# Patient Record
Sex: Female | Born: 1990 | Race: Asian | Hispanic: No | Marital: Married | Smoking: Never smoker
Health system: Southern US, Community
[De-identification: ages and names within clinical notes are randomized; demographics above are authoritative.]

## PROBLEM LIST (undated history)

## (undated) DIAGNOSIS — E282 Polycystic ovarian syndrome: Secondary | ICD-10-CM

## (undated) DIAGNOSIS — Z789 Other specified health status: Secondary | ICD-10-CM

## (undated) DIAGNOSIS — O24419 Gestational diabetes mellitus in pregnancy, unspecified control: Secondary | ICD-10-CM

## (undated) HISTORY — DX: Polycystic ovarian syndrome: E28.2

## (undated) HISTORY — PX: NO PAST SURGERIES: SHX2092

## (undated) HISTORY — DX: Other specified health status: Z78.9

---
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
## 2015-06-17 NOTE — L&D Delivery Note (Signed)
Delivery Note At 5:30 AM a viable and healthy female was delivered via Vaginal, Spontaneous Delivery (Presentation:OA ).  APGAR: 8, 9; weight  -pending  Placenta status: spontaneous and complete. No cord complications: .  Cord pH: none  Anesthesia: Spinal   Episiotomy: None Lacerations:  1st degree perineal Suture Repair: 3.0 vicryl rapide Est. Blood Loss (mL):  250 cc  Mom to postpartum.  Baby to Couplet care / Skin to Skin.  Earlisha Sharples R 01/09/2016, 6:00 AM

## 2015-06-27 LAB — OB RESULTS CONSOLE RUBELLA ANTIBODY, IGM: RUBELLA: IMMUNE

## 2015-06-27 LAB — OB RESULTS CONSOLE ABO/RH: RH Type: POSITIVE

## 2015-06-27 LAB — OB RESULTS CONSOLE GC/CHLAMYDIA
Chlamydia: NEGATIVE
GC PROBE AMP, GENITAL: NEGATIVE

## 2015-06-27 LAB — OB RESULTS CONSOLE HEPATITIS B SURFACE ANTIGEN: Hepatitis B Surface Ag: NEGATIVE

## 2015-06-27 LAB — OB RESULTS CONSOLE HIV ANTIBODY (ROUTINE TESTING): HIV: NONREACTIVE

## 2015-06-27 LAB — OB RESULTS CONSOLE ANTIBODY SCREEN: ANTIBODY SCREEN: NEGATIVE

## 2015-06-27 LAB — OB RESULTS CONSOLE RPR: RPR: NONREACTIVE

## 2015-06-29 ENCOUNTER — Ambulatory Visit (INDEPENDENT_AMBULATORY_CARE_PROVIDER_SITE_OTHER): Payer: BLUE CROSS/BLUE SHIELD | Admitting: Internal Medicine

## 2015-06-29 ENCOUNTER — Encounter: Payer: Self-pay | Admitting: Internal Medicine

## 2015-06-29 DIAGNOSIS — Z23 Encounter for immunization: Secondary | ICD-10-CM

## 2015-06-29 DIAGNOSIS — Z9189 Other specified personal risk factors, not elsewhere classified: Secondary | ICD-10-CM

## 2015-06-29 MED ORDER — AZITHROMYCIN 500 MG PO TABS: 500.0000 mg | ORAL_TABLET | Freq: Every day | ORAL | Status: DC

## 2015-06-29 NOTE — Progress Notes (Signed)
   Subjective:    Patient ID: Misty Harris, female    DOB: [birth year], 25 y.o.   MRN: [id number]  HPI  zarai is a healthy 25y F in good state of health, currently [redacted] wk pregnant without any 1st trimester morning sickness, who will be going to [place] for [place]. Leaving feb 18th thru feb 25th. The patient has recently returned from [place] visiting family without difficulty  She is unsure of her previous vaccines that she received for her greencard last year. She will bring back list   She has not yet had flu vaccine or tdap, just met her ob this week  She is here to meningococcal vaccine for her visa application   Review of Systems     Objective:   Physical Exam        Assessment & Plan:  Pre-travel counseling in addition to vaccine recs:  -meningococcal vaccine today - recommend that she gets tdap and flu (sooner than later) - also recommend to get injectable typhoid and hep A, safe for pregnancy since neither are live vaccines. Can receive 10 days prior to leaving

## 2015-11-14 ENCOUNTER — Ambulatory Visit: Payer: BLUE CROSS/BLUE SHIELD | Admitting: Skilled Nursing Facility1

## 2015-11-26 ENCOUNTER — Encounter: Payer: BLUE CROSS/BLUE SHIELD | Attending: Obstetrics & Gynecology | Admitting: Skilled Nursing Facility1

## 2015-11-26 ENCOUNTER — Encounter: Payer: Self-pay | Admitting: Skilled Nursing Facility1

## 2015-11-26 VITALS — Ht 64.0 in | Wt 213.0 lb

## 2015-11-26 DIAGNOSIS — O24410 Gestational diabetes mellitus in pregnancy, diet controlled: Secondary | ICD-10-CM

## 2015-11-26 DIAGNOSIS — O24419 Gestational diabetes mellitus in pregnancy, unspecified control: Secondary | ICD-10-CM | POA: Insufficient documentation

## 2015-11-26 NOTE — Progress Notes (Signed)
  Patient was seen on 11/26/2015 for Gestational Diabetes self-management class at the [facility]. The following learning objectives were met by the patient during this course:   States the definition of Gestational Diabetes  States why dietary management is important in controlling blood glucose  Describes the effects each nutrient has on blood glucose levels  Demonstrates ability to create a balanced meal plan  Demonstrates carbohydrate counting   States when to check blood glucose levels involving a total of 4 separate occurences in a day  Demonstrates proper blood glucose monitoring techniques  States the effect of stress and exercise on blood glucose levels  States the importance of limiting caffeine and abstaining from alcohol and smoking  Demonstrates the knowledge the glucometer provided in class may not be covered by their insurance and to call their insurance provider immediately after class to know which glucometer their insurance provider does cover as well as calling their physician the next day for a prescription to the glucometer their insurance does cover (if the one provided is not) as well as the lancets and strips for that meter.  Blood glucose monitor given: Pt had their own Blood glucose reading: Did not bring their meter to class.  Patient instructed to monitor glucose levels: FBS: 60 - <90 1 hour: <140 2 hour: <120  *Patient received handouts:  Nutrition Diabetes and Pregnancy  Carbohydrate Counting List  Patient will be seen for follow-up as needed.

## 2015-12-04 DIAGNOSIS — O24414 Gestational diabetes mellitus in pregnancy, insulin controlled: Secondary | ICD-10-CM | POA: Diagnosis not present

## 2015-12-04 DIAGNOSIS — Z3A33 33 weeks gestation of pregnancy: Secondary | ICD-10-CM | POA: Diagnosis not present

## 2015-12-05 ENCOUNTER — Other Ambulatory Visit (HOSPITAL_COMMUNITY): Payer: Self-pay | Admitting: Obstetrics & Gynecology

## 2015-12-05 DIAGNOSIS — Z3689 Encounter for other specified antenatal screening: Secondary | ICD-10-CM

## 2015-12-05 DIAGNOSIS — O283 Abnormal ultrasonic finding on antenatal screening of mother: Secondary | ICD-10-CM

## 2015-12-05 DIAGNOSIS — Z3A36 36 weeks gestation of pregnancy: Secondary | ICD-10-CM

## 2015-12-12 ENCOUNTER — Encounter (HOSPITAL_COMMUNITY): Payer: Self-pay | Admitting: Obstetrics & Gynecology

## 2015-12-20 ENCOUNTER — Other Ambulatory Visit (HOSPITAL_COMMUNITY): Payer: Self-pay | Admitting: Obstetrics & Gynecology

## 2015-12-20 ENCOUNTER — Encounter (HOSPITAL_COMMUNITY): Payer: Self-pay

## 2015-12-20 ENCOUNTER — Ambulatory Visit (HOSPITAL_COMMUNITY)
Admission: RE | Admit: 2015-12-20 | Discharge: 2015-12-20 | Disposition: A | Discharge status: Home / Self Care | Payer: BLUE CROSS/BLUE SHIELD | Source: Ambulatory Visit | Attending: Obstetrics & Gynecology | Admitting: Obstetrics & Gynecology

## 2015-12-20 ENCOUNTER — Ambulatory Visit (HOSPITAL_COMMUNITY): Admission: RE | Admit: 2015-12-20 | Payer: BLUE CROSS/BLUE SHIELD | Source: Ambulatory Visit

## 2015-12-20 DIAGNOSIS — O283 Abnormal ultrasonic finding on antenatal screening of mother: Secondary | ICD-10-CM

## 2015-12-20 DIAGNOSIS — O358XX0 Maternal care for other (suspected) fetal abnormality and damage, not applicable or unspecified: Secondary | ICD-10-CM | POA: Diagnosis not present

## 2015-12-20 DIAGNOSIS — Z3A36 36 weeks gestation of pregnancy: Secondary | ICD-10-CM | POA: Diagnosis not present

## 2015-12-20 DIAGNOSIS — O24414 Gestational diabetes mellitus in pregnancy, insulin controlled: Secondary | ICD-10-CM | POA: Diagnosis not present

## 2015-12-20 DIAGNOSIS — Z36 Encounter for antenatal screening of mother: Secondary | ICD-10-CM | POA: Insufficient documentation

## 2015-12-20 DIAGNOSIS — Z3689 Encounter for other specified antenatal screening: Secondary | ICD-10-CM

## 2015-12-20 LAB — OB RESULTS CONSOLE GBS: STREP GROUP B AG: POSITIVE

## 2015-12-21 ENCOUNTER — Encounter (HOSPITAL_COMMUNITY): Payer: Self-pay

## 2015-12-28 DIAGNOSIS — Z3A37 37 weeks gestation of pregnancy: Secondary | ICD-10-CM | POA: Diagnosis not present

## 2015-12-28 DIAGNOSIS — O24414 Gestational diabetes mellitus in pregnancy, insulin controlled: Secondary | ICD-10-CM | POA: Diagnosis not present

## 2015-12-31 DIAGNOSIS — O24414 Gestational diabetes mellitus in pregnancy, insulin controlled: Secondary | ICD-10-CM | POA: Diagnosis not present

## 2015-12-31 DIAGNOSIS — Z3A37 37 weeks gestation of pregnancy: Secondary | ICD-10-CM | POA: Diagnosis not present

## 2016-01-01 ENCOUNTER — Telehealth (HOSPITAL_COMMUNITY): Payer: Self-pay | Admitting: *Deleted

## 2016-01-01 NOTE — Telephone Encounter (Signed)
Preadmission screenPreadmission screen 

## 2016-01-02 ENCOUNTER — Encounter (HOSPITAL_COMMUNITY): Payer: Self-pay | Admitting: *Deleted

## 2016-01-03 DIAGNOSIS — O24414 Gestational diabetes mellitus in pregnancy, insulin controlled: Secondary | ICD-10-CM | POA: Diagnosis not present

## 2016-01-03 DIAGNOSIS — Z3A37 37 weeks gestation of pregnancy: Secondary | ICD-10-CM | POA: Diagnosis not present

## 2016-01-04 ENCOUNTER — Other Ambulatory Visit: Payer: Self-pay | Admitting: Obstetrics & Gynecology

## 2016-01-08 ENCOUNTER — Inpatient Hospital Stay (HOSPITAL_COMMUNITY): Payer: BLUE CROSS/BLUE SHIELD | Admitting: Anesthesiology

## 2016-01-08 ENCOUNTER — Encounter (HOSPITAL_COMMUNITY): Payer: Self-pay

## 2016-01-08 ENCOUNTER — Inpatient Hospital Stay (HOSPITAL_COMMUNITY)
Admission: RE | Admit: 2016-01-08 | Discharge: 2016-01-11 | DRG: 775 | Disposition: A | Discharge status: Home / Self Care | Payer: BLUE CROSS/BLUE SHIELD | Source: Ambulatory Visit | Attending: Obstetrics & Gynecology | Admitting: Obstetrics & Gynecology

## 2016-01-08 DIAGNOSIS — O24419 Gestational diabetes mellitus in pregnancy, unspecified control: Secondary | ICD-10-CM

## 2016-01-08 DIAGNOSIS — O99214 Obesity complicating childbirth: Secondary | ICD-10-CM | POA: Diagnosis not present

## 2016-01-08 DIAGNOSIS — O24424 Gestational diabetes mellitus in childbirth, insulin controlled: Secondary | ICD-10-CM | POA: Diagnosis not present

## 2016-01-08 DIAGNOSIS — Z6836 Body mass index (BMI) 36.0-36.9, adult: Secondary | ICD-10-CM

## 2016-01-08 DIAGNOSIS — Z833 Family history of diabetes mellitus: Secondary | ICD-10-CM

## 2016-01-08 DIAGNOSIS — Z3A39 39 weeks gestation of pregnancy: Secondary | ICD-10-CM

## 2016-01-08 DIAGNOSIS — Z8249 Family history of ischemic heart disease and other diseases of the circulatory system: Secondary | ICD-10-CM | POA: Diagnosis not present

## 2016-01-08 DIAGNOSIS — E669 Obesity, unspecified: Secondary | ICD-10-CM | POA: Diagnosis not present

## 2016-01-08 DIAGNOSIS — E282 Polycystic ovarian syndrome: Secondary | ICD-10-CM | POA: Diagnosis present

## 2016-01-08 DIAGNOSIS — O99824 Streptococcus B carrier state complicating childbirth: Secondary | ICD-10-CM | POA: Diagnosis present

## 2016-01-08 DIAGNOSIS — O24425 Gestational diabetes mellitus in childbirth, controlled by oral hypoglycemic drugs: Principal | ICD-10-CM | POA: Diagnosis present

## 2016-01-08 HISTORY — DX: Gestational diabetes mellitus in pregnancy, unspecified control: O24.419

## 2016-01-08 LAB — GLUCOSE, RANDOM: Glucose, Bld: 99 mg/dL (ref 65–99)

## 2016-01-08 LAB — CBC
HEMATOCRIT: 38.2 % (ref 36.0–46.0)
HEMOGLOBIN: 12.5 g/dL (ref 12.0–15.0)
MCH: 25.6 pg — AB (ref 26.0–34.0)
MCHC: 32.7 g/dL (ref 30.0–36.0)
MCV: 78.1 fL (ref 78.0–100.0)
Platelets: 219 10*3/uL (ref 150–400)
RBC: 4.89 MIL/uL (ref 3.87–5.11)
RDW: 19.4 % — ABNORMAL HIGH (ref 11.5–15.5)
WBC: 11.1 10*3/uL — ABNORMAL HIGH (ref 4.0–10.5)

## 2016-01-08 LAB — GLUCOSE, CAPILLARY
GLUCOSE-CAPILLARY: 90 mg/dL (ref 65–99)
GLUCOSE-CAPILLARY: 80 mg/dL (ref 65–99)
Glucose-Capillary: 65 mg/dL (ref 65–99)

## 2016-01-08 LAB — TYPE AND SCREEN
ABO/RH(D): A POS
Antibody Screen: NEGATIVE

## 2016-01-08 LAB — ABO/RH: ABO/RH(D): A POS

## 2016-01-08 LAB — RPR: RPR: NONREACTIVE

## 2016-01-08 MED ORDER — MISOPROSTOL 25 MCG QUARTER TABLET
25.0000 ug | ORAL_TABLET | ORAL | Status: DC | PRN
  Administered 2016-01-08: 25 ug via VAGINAL
  Filled 2016-01-08 (×2): qty 0.25

## 2016-01-08 MED ORDER — FENTANYL 2.5 MCG/ML BUPIVACAINE 1/10 % EPIDURAL INFUSION (WH - ANES)
14.0000 mL/h | INTRAMUSCULAR | Status: DC | PRN
  Administered 2016-01-08 – 2016-01-09 (×2): 14 mL/h via EPIDURAL
  Filled 2016-01-08 (×2): qty 125

## 2016-01-08 MED ORDER — LACTATED RINGERS IV SOLN
INTRAVENOUS | Status: DC
  Administered 2016-01-08 (×2): via INTRAVENOUS

## 2016-01-08 MED ORDER — PENICILLIN G POTASSIUM 5000000 UNITS IJ SOLR
2.5000 10*6.[IU] | INTRAVENOUS | Status: DC
  Administered 2016-01-08 – 2016-01-09 (×4): 2.5 10*6.[IU] via INTRAVENOUS
  Filled 2016-01-08 (×9): qty 2.5

## 2016-01-08 MED ORDER — EPHEDRINE 5 MG/ML INJ: 10.0000 mg | INTRAVENOUS | Status: DC | PRN

## 2016-01-08 MED ORDER — LIDOCAINE HCL (PF) 1 % IJ SOLN
INTRAMUSCULAR | Status: DC | PRN
  Administered 2016-01-08: 8 mL via EPIDURAL
  Administered 2016-01-08: 5 mL via EPIDURAL

## 2016-01-08 MED ORDER — PHENYLEPHRINE 40 MCG/ML (10ML) SYRINGE FOR IV PUSH (FOR BLOOD PRESSURE SUPPORT): 80.0000 ug | PREFILLED_SYRINGE | INTRAVENOUS | Status: DC | PRN

## 2016-01-08 MED ORDER — TERBUTALINE SULFATE 1 MG/ML IJ SOLN: 0.2500 mg | Freq: Once | INTRAMUSCULAR | Status: DC | PRN

## 2016-01-08 MED ORDER — DIPHENHYDRAMINE HCL 50 MG/ML IJ SOLN: 12.5000 mg | INTRAMUSCULAR | Status: DC | PRN

## 2016-01-08 MED ORDER — NALBUPHINE HCL 10 MG/ML IJ SOLN
10.0000 mg | Freq: Once | INTRAMUSCULAR | Status: AC
  Administered 2016-01-08: 10 mg via INTRAVENOUS
  Filled 2016-01-08: qty 1

## 2016-01-08 MED ORDER — OXYTOCIN 40 UNITS IN LACTATED RINGERS INFUSION - SIMPLE MED
1.0000 m[IU]/min | INTRAVENOUS | Status: DC
  Administered 2016-01-08: 2 m[IU]/min via INTRAVENOUS
  Filled 2016-01-08: qty 1000

## 2016-01-08 MED ORDER — PENICILLIN G POTASSIUM 5000000 UNITS IJ SOLR
5.0000 10*6.[IU] | Freq: Once | INTRAVENOUS | Status: AC
  Administered 2016-01-08: 5 10*6.[IU] via INTRAVENOUS
  Filled 2016-01-08: qty 5

## 2016-01-08 MED ORDER — LACTATED RINGERS IV SOLN
500.0000 mL | Freq: Once | INTRAVENOUS | Status: AC
  Administered 2016-01-08: 500 mL via INTRAVENOUS

## 2016-01-08 MED ORDER — PHENYLEPHRINE 40 MCG/ML (10ML) SYRINGE FOR IV PUSH (FOR BLOOD PRESSURE SUPPORT)
80.0000 ug | PREFILLED_SYRINGE | INTRAVENOUS | Status: DC | PRN
  Filled 2016-01-08: qty 10

## 2016-01-08 NOTE — Anesthesia Pain Management Evaluation Note (Signed)
  CRNA Pain Management Visit Note  Patient: Misty Harris, 25 y.o., female  "Hello I am a member of the anesthesia team at [facility]. We have an anesthesia team available at all times to provide care throughout the hospital, including epidural management and anesthesia for C-section. I don't know your plan for the delivery whether it a natural birth, water birth, IV sedation, nitrous supplementation, doula or epidural, but we want to meet your pain goals."   1.Was your pain managed to your expectations on prior hospitalizations?   No prior hospitalizations  2.What is your expectation for pain management during this hospitalization?     Epidural  3.How can we help you reach that goal? epidural  Record the patient's initial score and the patient's pain goal.   Pain: 1  Pain Goal: 8 [facility] wants you to be able to say your pain was always managed very well.  Edison Pace 01/08/2016

## 2016-01-08 NOTE — Anesthesia Procedure Notes (Signed)
Epidural Patient location during procedure: OB Start time: 01/08/2016 5:28 PM End time: 01/08/2016 5:32 PM  Staffing Anesthesiologist: Leilani Able Performed: anesthesiologist   Preanesthetic Checklist Completed: patient identified, surgical consent, pre-op evaluation, timeout performed, IV checked, risks and benefits discussed and monitors and equipment checked  Epidural Patient position: sitting Prep: site prepped and draped and DuraPrep Patient monitoring: continuous pulse ox and blood pressure Approach: midline Location: L3-L4 Injection technique: LOR air  Needle:  Needle type: Tuohy  Needle gauge: 17 G Needle length: 9 cm and 9 Needle insertion depth: 5 cm cm Catheter type: closed end flexible Catheter size: 19 Gauge Catheter at skin depth: 10 cm Test dose: negative and Other  Assessment Sensory level: T9 Events: blood not aspirated, injection not painful, no injection resistance, negative IV test and no paresthesia

## 2016-01-08 NOTE — Progress Notes (Signed)
Misty Harris is a 25 y.o. G1P0 at [redacted]w[redacted]d by ultrasound admitted for induction of labor due to Gestational diabetes.  Subjective:   Objective: BP 119/82   Pulse 78   Temp 98 F (36.7 C) (Oral)   Resp 18   Ht 5\' 4"  (1.626 m)   Wt 215 lb (97.5 kg)   BMI 36.90 kg/m  No intake/output data recorded. Total I/O In: 120 [P.O.:120] Out: -   FHT:  FHR: 150 bpm, variability: moderate,  accelerations:  Present,  decelerations:  Absent UC:   regular, every 3 minutes SVE:   Dilation: 2 Effacement (%): 50 Station: -2 Exam by:: Marcelino Duster, RN  AROM, clear, high station.   Labs: Lab Results  Component Value Date   WBC 11.1 (H) 01/08/2016   HGB 12.5 01/08/2016   HCT 38.2 01/08/2016   MCV 78.1 01/08/2016   PLT 219 01/08/2016    Assessment / Plan: Induction of labor due to gestational diabetes,  progressing well on pitocin  Labor: Progressing normally, early labor  Preeclampsia:  None Fetal Wellbeing:  Category I Pain Control:  Labor support without medications I/D:  GBS(+), started PCN Anticipated MOD:  Guarded, assess descent  Limit exams. Ok to Nubain later, reduced pitocin now to 2 mu rate, ambulate in room  Teiara Baria R 01/08/2016, 4:10 PM

## 2016-01-08 NOTE — H&P (Signed)
Misty Harris is a 25 y.o. female presenting for labor IOL at 39 wks for A2GDM. Infertility and PCOS hx, pregnancy conceived with Femara, weight loss, Metformin. Stopped Metformin herself at positive preg test.  A2GDM, poor diet choices, started Glyburide 1.25 mg bid and Metformin 500 mg bid at 32 wks. Increased Glyburide to 2.5 mg bid at 36 wks Growth at 28 wk AGA, 34 wks - 5'2" 71%.  MFM sono at 36 wks for slight ventriculomegaly, left lateral ventricle 10.3 mm. Also Right Renal pelvis dilated, last sono 10 mm at 34 wks  OB History    Gravida Para Term Preterm AB Living   1         0   SAB TAB Ectopic Multiple Live Births                 Past Medical History:  Diagnosis Date  . GDM, class A2 01/08/2016  . Medical history non-contributory    Past Surgical History:  Procedure Laterality Date  . NO PAST SURGERIES     Family History: family history includes Diabetes in her father; Heart disease in her father; Hypertension in her father. Social History:  reports that she has never smoked. She has never used smokeless tobacco. She reports that she does not drink alcohol or use drugs.     Maternal Diabetes: Yes:  Diabetes Type:  Insulin/Medication controlled Genetic Screening: Normal Maternal Ultrasounds/Referrals: Right renal pylectasis, last sono 10 mm at 34 wks. Left lateral ventriculomegaly- slight, 10.3 mm at 36 wks and saw MFM for that at 36 wks, no other structural abnormalities noted. Recommended Peds to f/up after birth.  Fetal Ultrasounds or other Referrals:  MFM as above at 36 wks  Maternal Substance Abuse:  No Significant Maternal Medications:  Meds include: Other:  Glyburide 2.5 mg bid, Metformin 500mg  bid Significant Maternal Lab Results:  Lab values include: Group B Strep positive Other Comments:  None  ROS neg  History Dilation: Closed Effacement (%): 50 Station: -2 Exam by:: Marcelino Duster, RN  Blood pressure 116/71, pulse 85, temperature 98 F (36.7 C), temperature  source Oral, resp. rate 18, height 5\' 4"  (1.626 m), weight 215 lb (97.5 kg). Exam Physical Exam  A&O x 3, no acute distress. Pleasant HEENT neg, no thyromegaly Lungs CTA bilat CV RRR, S1S2 normal Abdo soft, non tender, non acute Extr no edema/ tenderness Pelvic above  FHT  140/ + accels/ no decels/ mod variab category I Toco q 3 minutes/    Prenatal labs: ABO, Rh: --/--/A POS, A POS (07/25 0100) Antibody: NEG (07/25 0100) Rubella: Immune (01/11 0000) RPR: Non Reactive (07/25 0100)  HBsAg: Negative (01/11 0000)  HIV: Non-reactive (01/11 0000)  GBS: Positive (07/06 0000)   Assessment/Plan: 25 yo G1 at 39 wks, A2GDM. IOL. Cytotec x 1 last night. Could not give more Cytotec due to UCs. Now on pitocin.  BG checks q 4 hrs.  FHT now category I. EWF anticipate SVD provided adequate descent noted in labor. Early labor at present.   Fetal abnormal sono findings - Left lateral ventricle slightly dilated at 10.3 mm and right renal pelvis dilated at 36 wks, f/up after birth.   Misty Harris R 01/08/2016, 2:41 PM

## 2016-01-08 NOTE — Progress Notes (Signed)
Misty Harris is a 25 y.o. G1P0 at [redacted]w[redacted]d A2 GDM IOL Subjective: Could not tolerate UCs at 2 mu rate, so gave early epidural   Objective: BP 113/80   Pulse 69   Temp 97.7 F (36.5 C) (Oral)   Resp 18   Ht 5\' 4"  (1.626 m)   Wt 215 lb (97.5 kg)   SpO2 100%   BMI 36.90 kg/m  I/O last 3 completed shifts: In: 240 [P.O.:240] Out: -  No intake/output data recorded.  FHT:  FHR: 120s bpm, variability: moderate,  accelerations:  Present,  decelerations:  Absent- late decels resolved since pitocin was turned off  UC:   regular, every 3 minutes SVE:   Dilation: 2 Effacement (%): 70 Station: -2 Exam by:: Marcelino Duster, RN   Labs: Lab Results  Component Value Date   WBC 11.1 (H) 01/08/2016   HGB 12.5 01/08/2016   HCT 38.2 01/08/2016   MCV 78.1 01/08/2016   PLT 219 01/08/2016    Assessment / Plan: Restarted pitocin since FHT category I. Need to increase to allow good labor pattern. High station and borderline pelvis.  MOD: Guarded.   Kyan Yurkovich R 01/08/2016, 8:10 PM

## 2016-01-08 NOTE — Anesthesia Preprocedure Evaluation (Signed)
Anesthesia Evaluation  Patient identified by MRN, date of birth, ID band Patient awake    Reviewed: Allergy & Precautions, H&P , NPO status , Patient's Chart, lab work & pertinent test results  Airway Mallampati: II  TM Distance: >3 FB Neck ROM: full    Dental no notable dental hx.    Pulmonary neg pulmonary ROS,    Pulmonary exam normal        Cardiovascular negative cardio ROS Normal cardiovascular exam     Neuro/Psych negative neurological ROS  negative psych ROS   GI/Hepatic negative GI ROS, Neg liver ROS,   Endo/Other  negative endocrine ROSdiabetes, Gestational, Oral Hypoglycemic Agents  Renal/GU negative Renal ROS     Musculoskeletal   Abdominal (+) + obese,   Peds  Hematology negative hematology ROS (+)   Anesthesia Other Findings   Reproductive/Obstetrics (+) Pregnancy                             Anesthesia Physical Anesthesia Plan  ASA: II  Anesthesia Plan: Epidural   Post-op Pain Management:    Induction:   Airway Management Planned:   Additional Equipment:   Intra-op Plan:   Post-operative Plan:   Informed Consent: I have reviewed the patients History and Physical, chart, labs and discussed the procedure including the risks, benefits and alternatives for the proposed anesthesia with the patient or authorized representative who has indicated his/her understanding and acceptance.     Plan Discussed with:   Anesthesia Plan Comments:         Anesthesia Quick Evaluation

## 2016-01-09 ENCOUNTER — Encounter (HOSPITAL_COMMUNITY): Payer: Self-pay

## 2016-01-09 LAB — GLUCOSE, CAPILLARY
GLUCOSE-CAPILLARY: 100 mg/dL — AB (ref 65–99)
Glucose-Capillary: 77 mg/dL (ref 65–99)

## 2016-01-09 MED ORDER — LIDOCAINE HCL (PF) 1 % IJ SOLN
INTRAMUSCULAR | Status: AC
  Filled 2016-01-09: qty 30

## 2016-01-09 MED ORDER — IBUPROFEN 600 MG PO TABS
600.0000 mg | ORAL_TABLET | Freq: Four times a day (QID) | ORAL | Status: DC
  Administered 2016-01-09 – 2016-01-11 (×8): 600 mg via ORAL
  Filled 2016-01-09 (×8): qty 1

## 2016-01-09 MED ORDER — DIPHENHYDRAMINE HCL 25 MG PO CAPS: 25.0000 mg | ORAL_CAPSULE | Freq: Four times a day (QID) | ORAL | Status: DC | PRN

## 2016-01-09 MED ORDER — ONDANSETRON HCL 4 MG/2ML IJ SOLN: 4.0000 mg | INTRAMUSCULAR | Status: DC | PRN

## 2016-01-09 MED ORDER — SIMETHICONE 80 MG PO CHEW: 80.0000 mg | CHEWABLE_TABLET | ORAL | Status: DC | PRN

## 2016-01-09 MED ORDER — PRENATAL MULTIVITAMIN CH
1.0000 | ORAL_TABLET | Freq: Every day | ORAL | Status: DC
  Administered 2016-01-09 – 2016-01-10 (×2): 1 via ORAL
  Filled 2016-01-09 (×2): qty 1

## 2016-01-09 MED ORDER — ONDANSETRON HCL 4 MG PO TABS: 4.0000 mg | ORAL_TABLET | ORAL | Status: DC | PRN

## 2016-01-09 MED ORDER — TETANUS-DIPHTH-ACELL PERTUSSIS 5-2.5-18.5 LF-MCG/0.5 IM SUSP
0.5000 mL | Freq: Once | INTRAMUSCULAR | Status: AC
  Administered 2016-01-10: 0.5 mL via INTRAMUSCULAR

## 2016-01-09 MED ORDER — WITCH HAZEL-GLYCERIN EX PADS: 1.0000 "application " | MEDICATED_PAD | CUTANEOUS | Status: DC | PRN

## 2016-01-09 MED ORDER — ZOLPIDEM TARTRATE 5 MG PO TABS: 5.0000 mg | ORAL_TABLET | Freq: Every evening | ORAL | Status: DC | PRN

## 2016-01-09 MED ORDER — COCONUT OIL OIL
1.0000 "application " | TOPICAL_OIL | Status: DC | PRN
  Filled 2016-01-09: qty 120

## 2016-01-09 MED ORDER — ACETAMINOPHEN 325 MG PO TABS
650.0000 mg | ORAL_TABLET | ORAL | Status: DC | PRN
  Administered 2016-01-09 – 2016-01-11 (×6): 650 mg via ORAL
  Filled 2016-01-09 (×7): qty 2

## 2016-01-09 MED ORDER — SENNOSIDES-DOCUSATE SODIUM 8.6-50 MG PO TABS
2.0000 | ORAL_TABLET | ORAL | Status: DC
  Administered 2016-01-09 – 2016-01-10 (×2): 2 via ORAL
  Filled 2016-01-09 (×2): qty 2

## 2016-01-09 MED ORDER — BENZOCAINE-MENTHOL 20-0.5 % EX AERO
1.0000 "application " | INHALATION_SPRAY | CUTANEOUS | Status: DC | PRN
  Administered 2016-01-09: 1 via TOPICAL
  Filled 2016-01-09 (×3): qty 56

## 2016-01-09 MED ORDER — DIBUCAINE 1 % RE OINT
1.0000 "application " | TOPICAL_OINTMENT | RECTAL | Status: DC | PRN
  Filled 2016-01-09: qty 28.4

## 2016-01-09 NOTE — Lactation Note (Addendum)
This note was copied from a baby's chart. Lactation Consultation Note:  Lactation Brochure given to patient with basic teaching done. This is the second attempt to see this mother for feeding assist. Infant has has low cbg of 37 ,46 and 38. Infant has been bottle fed formula after each cbg. Mother states she tried to latch infant and she doesn't have enough milk.  Mother taught hand expression and observed good flow of colostrum.  Suggested to mother to page for [facility] with next feeding attempt.  Reviewed baby and me book. Informed mother that infant needs to breastfeed 8-12 times in 24 hours. Instruct mother in cue base feeding. Mother will page for assistance with next feeding attempt.   Patient Name: Boy Sheniqua Kohnen [id number] Date: 01/09/2016 Reason for consult: Initial assessment   Maternal Data Has patient been taught Hand Expression?: Yes Does the patient have breastfeeding experience prior to this delivery?: No  Feeding Feeding Type: Bottle Fed - Formula Nipple Type: Slow - flow  LATCH Score/Interventions Latch: Repeated attempts needed to sustain latch, nipple held in mouth throughout feeding, stimulation needed to elicit sucking reflex. (on off vigorus suckling expresed colostrum) Intervention(s): Adjust position;Assist with latch  Audible Swallowing: A few with stimulation Intervention(s): Skin to skin;Hand expression Intervention(s): Skin to skin;Hand expression  Type of Nipple: Everted at rest and after stimulation  Comfort (Breast/Nipple): Soft / non-tender     Hold (Positioning): Full assist, staff holds infant at breast  LATCH Score: 6  Lactation Tools Discussed/Used     Consult Status Consult Status: Follow-up Date: 01/09/16 Follow-up type: In-patient    Stevan Born [facility] 01/09/2016, 2:37 PM

## 2016-01-09 NOTE — Lactation Note (Signed)
This note was copied from a baby's chart. Lactation Consultation Note  Patient Name: Misty Harris [id number] Date: 01/09/2016 Reason for consult: Initial assessment Baby at 13 hr of life. Mom was requesting latch help. Baby was cueing upon entry. FOB had tried to offer a formula bottle but stated baby would not take it. Baby will open mouth wide, flange lips, and make a seal with the nipple. He will take 3-4 sucks then hold the nipple in his mouth. If stimulated he continues to repeat the same pattern. He will cup a gloved finger but does a lot of biting down and will only take 2-3 sucks. Encouraged parents to keep trying. Mom can easily manually express colostrum, reviewed spoon feeding. Mom declined spoon feeding at this time.  Mom stated she is in pain and needs to sleep. She is aware of lactation services and support group. She will call as needed.    Maternal Data Formula Feeding for Exclusion: No Has patient been taught Hand Expression?: Yes Does the patient have breastfeeding experience prior to this delivery?: No  Feeding Feeding Type: Breast Fed Length of feed: 4 min  LATCH Score/Interventions Latch: Repeated attempts needed to sustain latch, nipple held in mouth throughout feeding, stimulation needed to elicit sucking reflex. Intervention(s): Adjust position;Assist with latch;Breast massage;Breast compression  Audible Swallowing: None Intervention(s): Skin to skin;Hand expression Intervention(s): Alternate breast massage  Type of Nipple: Everted at rest and after stimulation  Comfort (Breast/Nipple): Soft / non-tender     Hold (Positioning): Full assist, staff holds infant at breast Intervention(s): Support Pillows;Position options  LATCH Score: 5  Lactation Tools Discussed/Used     Consult Status Consult Status: Follow-up Date: 01/10/16 Follow-up type: In-patient    Rulon Eisenmenger 01/09/2016, 6:44 PM

## 2016-01-09 NOTE — Progress Notes (Signed)
Patient educated on how often infant should feed. Mother is first time mom. Bladder education taught.

## 2016-01-09 NOTE — Progress Notes (Signed)
Misty Harris is a 25 y.o. G1P0 at [redacted]w[redacted]d IOL for A2GDM Subjective: No complaints.   Objective: BP (!) 105/55   Pulse 62   Temp 97.8 F (36.6 C) (Oral)   Resp 18   Ht 5\' 4"  (1.626 m)   Wt 215 lb (97.5 kg)   SpO2 100%   BMI 36.90 kg/m  I/O last 3 completed shifts: In: 240 [P.O.:240] Out: -  No intake/output data recorded.  FHT:  FHR: 140 bpm, variability: moderate,  accelerations:  Present,  decelerations:  Present repetitive late decels resolved with stopping pitocin UC:   regular, every 3 minutes SVE:   Dilation: 4 Effacement (%): 90 Station: -2 Exam by:: Dr. Juliene Pina   Assessment / Plan: Protracted early phase. High station and deflexed head. If tolerates well, will restart pitocin at 46mu and try and increase again but most likely continue at 87mu rate and see if progress possible. Reassess in few hours, limiting exams FHT now category I from II GBS(+), several doses of PCN  Misty Harris R 01/09/2016, 12:19 AM

## 2016-01-09 NOTE — Anesthesia Postprocedure Evaluation (Signed)
Anesthesia Post Note  Patient: Misty Harris  Procedure(s) Performed: * No procedures listed *  Patient location during evaluation: Mother Baby Anesthesia Type: Epidural Level of consciousness: awake, awake and alert, oriented and patient cooperative Pain management: pain level controlled Vital Signs Assessment: post-procedure vital signs reviewed and stable Respiratory status: spontaneous breathing, nonlabored ventilation and respiratory function stable Cardiovascular status: stable Postop Assessment: patient able to bend at knees, no headache, no backache and no signs of nausea or vomiting Anesthetic complications: no     Last Vitals:  Vitals:   01/09/16 0730 01/09/16 0834  BP: (!) 112/53 115/75  Pulse: 73 85  Resp: 18 18  Temp: 36.7 C 36.8 C    Last Pain:  Vitals:   01/09/16 0835  TempSrc:   PainSc: 2    Pain Goal: Patients Stated Pain Goal: 2 (01/09/16 0835)               Dante Cooter L

## 2016-01-10 LAB — CBC
HCT: 34.6 % — ABNORMAL LOW (ref 36.0–46.0)
Hemoglobin: 11.4 g/dL — ABNORMAL LOW (ref 12.0–15.0)
MCH: 25.9 pg — ABNORMAL LOW (ref 26.0–34.0)
MCHC: 32.9 g/dL (ref 30.0–36.0)
MCV: 78.5 fL (ref 78.0–100.0)
PLATELETS: 189 10*3/uL (ref 150–400)
RBC: 4.41 MIL/uL (ref 3.87–5.11)
RDW: 19.1 % — AB (ref 11.5–15.5)
WBC: 15.5 10*3/uL — AB (ref 4.0–10.5)

## 2016-01-10 NOTE — Progress Notes (Signed)
Patient ID: Misty Harris, female   DOB: [birth year], 25 y.o.   MRN: [id number] PPD # 1  Subjective: Pt reports feeling sore / Mod pain control with ibuprofen and tylenol Tolerating po/ Voiding without problems/ No n/v Bleeding is light Newborn info:  Information for the patient's newborn:  Misty, Harris [[id number]]  female  Infant with L lateral ventricle dilation.  Peds to f/u with Dr Juliene Pina. / circ pending / Feeding: breast   Objective:  VS: Blood pressure (!) 112/58, pulse 85, temperature 97.6 F (36.4 C), resp. rate 18, height 5\' 4"  (1.626 m), weight 97.5 kg (215 lb), SpO2 98 %, unknown if currently breastfeeding.    Recent Labs  01/08/16 0100 01/10/16 0521  WBC 11.1* 15.5*  HGB 12.5 11.4*  HCT 38.2 34.6*  PLT 219 189    Blood type: A POS Rubella: Immune    Physical Exam:  General: alert, cooperative and no distress CV: Regular rate and rhythm Resp: clear Abdomen: soft, nontender, normal bowel sounds Uterine Fundus: firm, below umbilicus, nontender Perineum: healing with good reapproximation Lochia: minimal Ext: edema trace and Homans sign is negative, no sign of DVT   A/P: PPD # 1/ G1P1001/ S/P: SVD GDM A2 / PCOS  Gluc <100 overnight Improve pain control with ibuprofen and tylenol Doing well Continue routine post partum orders Anticipate D/C home in AM    Demetrius Revel, MSN, [facility] 01/10/2016, 11:14 AM

## 2016-01-11 ENCOUNTER — Ambulatory Visit: Payer: Self-pay

## 2016-01-11 MED ORDER — OXYCODONE-ACETAMINOPHEN 5-325 MG PO TABS: 1.0000 | ORAL_TABLET | ORAL | 0 refills | Status: DC | PRN

## 2016-01-11 MED ORDER — IBUPROFEN 600 MG PO TABS: 600.0000 mg | ORAL_TABLET | Freq: Four times a day (QID) | ORAL | 0 refills | Status: DC

## 2016-01-11 NOTE — Discharge Instructions (Signed)
Breast Pumping Tips °If you are breastfeeding, there may be times when you cannot feed your baby directly. Returning to work or going on a trip are common examples. Pumping allows you to store breast milk and feed it to your baby later.  °You may not get much milk when you first start to pump. Your breasts should start to make more after a few days. If you pump at the times you usually feed your baby, you may be able to keep making enough milk to feed your baby without also using formula. The more often you pump, the more milk you will produce.  °WHEN SHOULD I PUMP?  °· You can begin to pump soon after delivery. However, some experts recommend waiting about 4 weeks before giving your infant a bottle to make sure breastfeeding is going well.  °· If you plan to return to work, begin pumping a few weeks before. This will help you develop techniques that work best for you. It also lets you build up a supply of breast milk.   °· When you are with your infant, feed on demand and pump after each feeding.   °· When you are away from your infant for several hours, pump for about 15 minutes every 2-3 hours. Pump both breasts at the same time if you can.   °· If your infant has a formula feeding, make sure to pump around the same time.     °· If you drink any alcohol, wait 2 hours before pumping.   °HOW DO I PREPARE TO PUMP? °Your let-down reflex is the natural reaction to stimulation that makes your breast milk flow. It is easier to stimulate this reflex when you are relaxed. Find relaxation techniques that work for you. If you have difficulty with your let-down reflex, try these methods:  °· Smell one of your infant's blankets or an item of clothing.   °· Look at a picture or video of your infant.   °· Sit in a quiet, private space.   °· Massage the breast you plan to pump.   °· Place soothing warmth on the breast.   °· Play relaxing music.   °WHAT ARE SOME GENERAL BREAST PUMPING TIPS? °· Wash your hands before you pump. You  do not need to wash your nipples or breasts. °· There are three ways to pump. °¨ You can use your hand to massage and compress your breast. °¨ You can use a handheld manual pump. °¨ You can use an electric pump.   °· Make sure the suction cup (flange) on the breast pump is the right size. Place the flange directly over the nipple. If it is the wrong size or placed the wrong way, it may be painful and cause nipple damage.   °· If pumping is uncomfortable, apply a small amount of purified or modified lanolin to your nipple and areola. °· If you are using an electric pump, adjust the speed and suction power to be more comfortable. °· If pumping is painful or if you are not getting very much milk, you may need a different type of pump. A lactation consultant can help you determine what type of pump to use.   °· Keep a full water bottle near you at all times. Drinking lots of fluid helps you make more milk.  °· You can store your milk to use later. Pumped breast milk can be stored in a sealable, sterile container or plastic bag. Label all stored breast milk with the date you pumped it. °¨ Milk can stay out at room temperature for up to 8 hours. °¨   You can store your milk in the refrigerator for up to 8 days. °¨ You can store your milk in the freezer for 3 months. Thaw frozen milk using warm water. Do not put it in the microwave. °· Do not smoke. Smoking can lower your milk supply and harm your infant. If you need help quitting, ask your health care provider to recommend a program.   °WHEN SHOULD I CALL MY HEALTH CARE PROVIDER OR A LACTATION CONSULTANT? °· You are having trouble pumping. °· You are concerned that you are not making enough milk. °· You have nipple pain, soreness, or redness. °· You want to use birth control. Birth control pills may lower your milk supply. Talk to your health care provider about your options. °  °This information is not intended to replace advice given to you by your health care provider.  Make sure you discuss any questions you have with your health care provider. °  °Document Released: 11/20/2009 Document Revised: 06/07/2013 Document Reviewed: 03/25/2013 °[employer]vier Interactive Patient Education ©2016 [employer] °Postpartum Depression and Baby Blues °The postpartum period begins right after the birth of a baby. During this time, there is often a great amount of joy and excitement. It is also a time of many changes in the life of the parents. Regardless of how many times a mother gives birth, each child brings new challenges and dynamics to the family. It is not unusual to have feelings of excitement along with confusing shifts in moods, emotions, and thoughts. All mothers are at risk of developing postpartum depression or the "baby blues." These mood changes can occur right after giving birth, or they may occur many months after giving birth. The baby blues or postpartum depression can be mild or severe. Additionally, postpartum depression can go away rather quickly, or it can be a long-term condition.  °CAUSES °Raised hormone levels and the rapid drop in those levels are thought to be a main cause of postpartum depression and the baby blues. A number of hormones change during and after pregnancy. Estrogen and progesterone usually decrease right after the delivery of your baby. The levels of thyroid hormone and various cortisol steroids also rapidly drop. Other factors that play a role in these mood changes include major life events and genetics.  °RISK FACTORS °If you have any of the following risks for the baby blues or postpartum depression, know what symptoms to watch out for during the postpartum period. Risk factors that may increase the likelihood of getting the baby blues or postpartum depression include: °· Having a personal or family history of depression.   °· Having depression while being pregnant.   °· Having premenstrual mood issues or mood issues related to oral  contraceptives. °· Having a lot of life stress.   °· Having marital conflict.   °· Lacking a social support network.   °· Having a baby with special needs.   °· Having health problems, such as diabetes.   °SIGNS AND SYMPTOMS °Symptoms of baby blues include: °· Brief changes in mood, such as going from extreme happiness to sadness. °· Decreased concentration.   °· Difficulty sleeping.   °· Crying spells, tearfulness.   °· Irritability.   °· Anxiety.   °Symptoms of postpartum depression typically begin within the first month after giving birth. These symptoms include: °· Difficulty sleeping or excessive sleepiness.   °· Marked weight loss.   °· Agitation.   °· Feelings of worthlessness.   °· Lack of interest in activity or food.   °Postpartum psychosis is a very serious condition and can be dangerous. Fortunately, it is   rare. Displaying any of the following symptoms is cause for immediate medical attention. Symptoms of postpartum psychosis include:  °· Hallucinations and delusions.   °· Bizarre or disorganized behavior.   °· Confusion or disorientation.   °DIAGNOSIS  °A diagnosis is made by an evaluation of your symptoms. There are no medical or lab tests that lead to a diagnosis, but there are various questionnaires that a health care provider may use to identify those with the baby blues, postpartum depression, or psychosis. Often, a screening tool called the Edinburgh Postnatal Depression Scale is used to diagnose depression in the postpartum period.  °TREATMENT °The baby blues usually goes away on its own in 1-2 weeks. Social support is often all that is needed. You will be encouraged to get adequate sleep and rest. Occasionally, you may be given medicines to help you sleep.  °Postpartum depression requires treatment because it can last several months or longer if it is not treated. Treatment may include individual or group therapy, medicine, or both to address any social, physiological, and psychological factors  that may play a role in the depression. Regular exercise, a healthy diet, rest, and social support may also be strongly recommended.  °Postpartum psychosis is more serious and needs treatment right away. Hospitalization is often needed. °HOME CARE INSTRUCTIONS °· Get as much rest as you can. Nap when the baby sleeps.   °· Exercise regularly. Some women find yoga and walking to be beneficial.   °· Eat a balanced and nourishing diet.   °· Do little things that you enjoy. Have a cup of tea, take a bubble bath, read your favorite magazine, or listen to your favorite music. °· Avoid alcohol.   °· Ask for help with household chores, cooking, grocery shopping, or running errands as needed. Do not try to do everything.   °· Talk to people close to you about how you are feeling. Get support from your partner, family members, friends, or other new moms. °· Try to stay positive in how you think. Think about the things you are grateful for.   °· Do not spend a lot of time alone.   °· Only take over-the-counter or prescription medicine as directed by your health care provider. °· Keep all your postpartum appointments.   °· Let your health care provider know if you have any concerns.   °SEEK MEDICAL CARE IF: °You are having a reaction to or problems with your medicine. °SEEK IMMEDIATE MEDICAL CARE IF: °· You have suicidal feelings.   °· You think you may harm the baby or someone else. °MAKE SURE YOU: °· Understand these instructions. °· Will watch your condition. °· Will get help right away if you are not doing well or get worse. °  °This information is not intended to replace advice given to you by your health care provider. Make sure you discuss any questions you have with your health care provider. °  °Document Released: 03/06/2004 Document Revised: 06/07/2013 Document Reviewed: 03/14/2013 °Elsevier Interactive Patient Education ©2016 [employer] °Postpartum Care After Vaginal Delivery °After you deliver your newborn  (postpartum period), the usual stay in the hospital is 24-72 hours. If there were problems with your labor or delivery, or if you have other medical problems, you might be in the hospital longer.  °While you are in the hospital, you will receive help and instructions on how to care for yourself and your newborn during the postpartum period.  °While you are in the hospital: °· Be sure to tell your nurses if you have pain or discomfort, as well as   where you feel the pain and what makes the pain worse. °· If you had an incision made near your vagina (episiotomy) or if you had some tearing during delivery, the nurses may put ice packs on your episiotomy or tear. The ice packs may help to reduce the pain and swelling. °· If you are breastfeeding, you may feel uncomfortable contractions of your uterus for a couple of weeks. This is normal. The contractions help your uterus get back to normal size. °· It is normal to have some bleeding after delivery. °¨ For the first 1-3 days after delivery, the flow is red and the amount may be similar to a period. °¨ It is common for the flow to start and stop. °¨ In the first few days, you may pass some small clots. Let your nurses know if you begin to pass large clots or your flow increases. °¨ Do not  flush blood clots down the toilet before having the nurse look at them. °¨ During the next 3-10 days after delivery, your flow should become more watery and pink or brown-tinged in color. °¨ Ten to fourteen days after delivery, your flow should be a small amount of yellowish-white discharge. °¨ The amount of your flow will decrease over the first few weeks after delivery. Your flow may stop in 6-8 weeks. Most women have had their flow stop by 12 weeks after delivery. °· You should change your sanitary pads frequently. °· Wash your hands thoroughly with soap and water for at least 20 seconds after changing pads, using the toilet, or before holding or feeding your newborn. °· You should  feel like you need to empty your bladder within the first 6-8 hours after delivery. °· In case you become weak, lightheaded, or faint, call your nurse before you get out of bed for the first time and before you take a shower for the first time. °· Within the first few days after delivery, your breasts may begin to feel tender and full. This is called engorgement. Breast tenderness usually goes away within 48-72 hours after engorgement occurs. You may also notice milk leaking from your breasts. If you are not breastfeeding, do not stimulate your breasts. Breast stimulation can make your breasts produce more milk. °· Spending as much time as possible with your newborn is very important. During this time, you and your newborn can feel close and get to know each other. Having your newborn stay in your room (rooming in) will help to strengthen the bond with your newborn.  It will give you time to get to know your newborn and become comfortable caring for your newborn. °· Your hormones change after delivery. Sometimes the hormone changes can temporarily cause you to feel sad or tearful. These feelings should not last more than a few days. If these feelings last longer than that, you should talk to your caregiver. °· If desired, talk to your caregiver about methods of family planning or contraception. °· Talk to your caregiver about immunizations. Your caregiver may want you to have the following immunizations before leaving the hospital: °¨ Tetanus, diphtheria, and pertussis (Tdap) or tetanus and diphtheria (Td) immunization. It is very important that you and your family (including grandparents) or others caring for your newborn are up-to-date with the Tdap or Td immunizations. The Tdap or Td immunization can help protect your newborn from getting ill. °¨ Rubella immunization. °¨ Varicella (chickenpox) immunization. °¨ Influenza immunization. You should receive this annual immunization if you did not receive the    immunization during your pregnancy. °  °This information is not intended to replace advice given to you by your health care provider. Make sure you discuss any questions you have with your health care provider. °  °Document Released: 03/30/2007 Document Revised: 02/25/2012 Document Reviewed: 01/28/2012 °[employer]vier Interactive Patient Education ©2016 [employer] °Breastfeeding and Mastitis °Mastitis is inflammation of the breast tissue. It can occur in women who are breastfeeding. This can make breastfeeding painful. Mastitis will sometimes go away on its own. Your health care provider will help determine if treatment is needed. °CAUSES °Mastitis is often associated with a blocked milk (lactiferous) duct. This can happen when too much milk builds up in the breast. Causes of excess milk in the breast can include: °· Poor latch-on. If your baby is not latched onto the breast properly, she or he may not empty your breast completely while breastfeeding. °· Allowing too much time to pass between feedings. °· Wearing a bra or other clothing that is too tight. This puts extra pressure on the lactiferous ducts so milk does not flow through them as it should. °Mastitis can also be caused by a bacterial infection. Bacteria may enter the breast tissue through cuts or openings in the skin. In women who are breastfeeding, this may occur because of cracked or irritated skin. Cracks in the skin are often caused when your baby does not latch on properly to the breast. °SIGNS AND SYMPTOMS °· Swelling, redness, tenderness, and pain in an area of the breast. °· Swelling of the glands under the arm on the same side. °· Fever may or may not accompany mastitis. °If an infection is allowed to progress, a collection of pus (abscess) may develop. °DIAGNOSIS  °Your health care provider can usually diagnose mastitis based on your symptoms and a physical exam. Tests may be done to help confirm the diagnosis. These may include: °· Removal of pus  from the breast by applying pressure to the area. This pus can be examined in the lab to determine which bacteria are present. If an abscess has developed, the fluid in the abscess can be removed with a needle. This can also be used to confirm the diagnosis and determine the bacteria present. In most cases, pus will not be present. °· Blood tests to determine if your body is fighting a bacterial infection. °· Mammogram or ultrasound tests to rule out other problems or diseases. °TREATMENT  °Mastitis that occurs with breastfeeding will sometimes go away on its own. Your health care provider may choose to wait 24 hours after first seeing you to decide whether a prescription medicine is needed. If your symptoms are worse after 24 hours, your health care provider will likely prescribe an antibiotic medicine to treat the mastitis. He or she will determine which bacteria are most likely causing the infection and will then select an appropriate antibiotic medicine. This is sometimes changed based on the results of tests performed to identify the bacteria, or if there is no response to the antibiotic medicine selected. Antibiotic medicines are usually given by mouth. You may also be given medicine for pain. °HOME CARE INSTRUCTIONS °· Only take over-the-counter or prescription medicines for pain, fever, or discomfort as directed by your health care provider. °· If your health care provider prescribed an antibiotic medicine, take the medicine as directed. Make sure you finish it even if you start to feel better. °· Do not wear a tight or underwire bra. Wear a soft, supportive bra. °· Increase your fluid   intake, especially if you have a fever. °· Continue to empty the breast. Your health care provider can tell you whether this milk is safe for your infant or needs to be thrown out. You may be told to stop nursing until your health care provider thinks it is safe for your baby. Use a breast pump if you are advised to stop  nursing. °· Keep your nipples clean and dry. °· Empty the first breast completely before going to the other breast. If your baby is not emptying your breasts completely for some reason, use a breast pump to empty your breasts. °· If you go back to work, pump your breasts while at work to stay in time with your nursing schedule. °· Avoid allowing your breasts to become overly filled with milk (engorged). °SEEK MEDICAL CARE IF: °· You have pus-like discharge from the breast. °· Your symptoms do not improve with the treatment prescribed by your health care provider within 2 days. °SEEK IMMEDIATE MEDICAL CARE IF: °· Your pain and swelling are getting worse. °· You have pain that is not controlled with medicine. °· You have a red line extending from the breast toward your armpit. °· You have a fever or persistent symptoms for more than 2-3 days. °· You have a fever and your symptoms suddenly get worse. °MAKE SURE YOU:  °· Understand these instructions. °· Will watch your condition. °· Will get help right away if you are not doing well or get worse. °  °This information is not intended to replace advice given to you by your health care provider. Make sure you discuss any questions you have with your health care provider. °  °Document Released: 09/27/2004 Document Revised: 06/07/2013 Document Reviewed: 01/06/2013 °[employer]vier Interactive Patient Education ©2016 [employer] ° °Breastfeeding °Deciding to breastfeed is one of the best choices you can make for you and your baby. A change in hormones during pregnancy causes your breast tissue to grow and increases the number and size of your milk ducts. These hormones also allow proteins, sugars, and fats from your blood supply to make breast milk in your milk-producing glands. Hormones prevent breast milk from being released before your baby is born as well as prompt milk flow after birth. Once breastfeeding has begun, thoughts of your baby, as well as his or her sucking or  crying, can stimulate the release of milk from your milk-producing glands.  °BENEFITS OF BREASTFEEDING °For Your Baby °· Your first milk (colostrum) helps your baby's digestive system function better. °· There are antibodies in your milk that help your baby fight off infections. °· Your baby has a lower incidence of asthma, allergies, and sudden infant death syndrome. °· The nutrients in breast milk are better for your baby than infant formulas and are designed uniquely for your baby's needs. °· Breast milk improves your baby's brain development. °· Your baby is less likely to develop other conditions, such as childhood obesity, asthma, or type 2 diabetes mellitus. °For You °· Breastfeeding helps to create a very special bond between you and your baby. °· Breastfeeding is convenient. Breast milk is always available at the correct temperature and costs nothing. °· Breastfeeding helps to burn calories and helps you lose the weight gained during pregnancy. °· Breastfeeding makes your uterus contract to its prepregnancy size faster and slows bleeding (lochia) after you give birth.   °· Breastfeeding helps to lower your risk of developing type 2 diabetes mellitus, osteoporosis, and breast or ovarian cancer later in life. °  SIGNS THAT YOUR BABY IS HUNGRY °Early Signs of Hunger °· Increased alertness or activity. °· Stretching. °· Movement of the head from side to side. °· Movement of the head and opening of the mouth when the corner of the mouth or cheek is stroked (rooting). °· Increased sucking sounds, smacking lips, cooing, sighing, or squeaking. °· Hand-to-mouth movements. °· Increased sucking of fingers or hands. °Late Signs of Hunger °· Fussing. °· Intermittent crying. °Extreme Signs of Hunger °Signs of extreme hunger will require calming and consoling before your baby will be able to breastfeed successfully. Do not wait for the following signs of extreme hunger to occur before you initiate  breastfeeding: °· Restlessness. °· A loud, strong cry. °· Screaming. °BREASTFEEDING BASICS °Breastfeeding Initiation °· Find a comfortable place to sit or lie down, with your neck and back well supported. °· Place a pillow or rolled up blanket under your baby to bring him or her to the level of your breast (if you are seated). Nursing pillows are specially designed to help support your arms and your baby while you breastfeed. °· Make sure that your baby's abdomen is facing your abdomen. °· Gently massage your breast. With your fingertips, massage from your chest wall toward your nipple in a circular motion. This encourages milk flow. You may need to continue this action during the feeding if your milk flows slowly. °· Support your breast with 4 fingers underneath and your thumb above your nipple. Make sure your fingers are well away from your nipple and your baby's mouth. °· Stroke your baby's lips gently with your finger or nipple. °· When your baby's mouth is open wide enough, quickly bring your baby to your breast, placing your entire nipple and as much of the colored area around your nipple (areola) as possible into your baby's mouth. °¨ More areola should be visible above your baby's upper lip than below the lower lip. °¨ Your baby's tongue should be between his or her lower gum and your breast. °· Ensure that your baby's mouth is correctly positioned around your nipple (latched). Your baby's lips should create a seal on your breast and be turned out (everted). °· It is common for your baby to suck about 2-3 minutes in order to start the flow of breast milk. °Latching °Teaching your baby how to latch on to your breast properly is very important. An improper latch can cause nipple pain and decreased milk supply for you and poor weight gain in your baby. Also, if your baby is not latched onto your nipple properly, he or she may swallow some air during feeding. This can make your baby fussy. Burping your baby when  you switch breasts during the feeding can help to get rid of the air. However, teaching your baby to latch on properly is still the best way to prevent fussiness from swallowing air while breastfeeding. °Signs that your baby has successfully latched on to your nipple: °· Silent tugging or silent sucking, without causing you pain. °· Swallowing heard between every 3-4 sucks. °· Muscle movement above and in front of his or her ears while sucking. °Signs that your baby has not successfully latched on to nipple: °· Sucking sounds or smacking sounds from your baby while breastfeeding. °· Nipple pain. °If you think your baby has not latched on correctly, slip your finger into the corner of your baby's mouth to break the suction and place it between your baby's gums. Attempt breastfeeding initiation again. °Signs of Successful Breastfeeding °  Signs from your baby: °· A gradual decrease in the number of sucks or complete cessation of sucking. °· Falling asleep. °· Relaxation of his or her body. °· Retention of a small amount of milk in his or her mouth. °· Letting go of your breast by himself or herself. °Signs from you: °· Breasts that have increased in firmness, weight, and size 1-3 hours after feeding. °· Breasts that are softer immediately after breastfeeding. °· Increased milk volume, as well as a change in milk consistency and color by the fifth day of breastfeeding. °· Nipples that are not sore, cracked, or bleeding. °Signs That Your Baby is Getting Enough Milk °· Wetting at least 3 diapers in a 24-hour period. The urine should be clear and pale yellow by age 5 days. °· At least 3 stools in a 24-hour period by age 5 days. The stool should be soft and yellow. °· At least 3 stools in a 24-hour period by age 7 days. The stool should be seedy and yellow. °· No loss of weight greater than 10% of birth weight during the first 3 days of age. °· Average weight gain of 4-7 ounces (113-198 g) per week after age 4  days. °· Consistent daily weight gain by age 5 days, without weight loss after the age of 2 weeks. °After a feeding, your baby may spit up a small amount. This is common. °BREASTFEEDING FREQUENCY AND DURATION °Frequent feeding will help you make more milk and can prevent sore nipples and breast engorgement. Breastfeed when you feel the need to reduce the fullness of your breasts or when your baby shows signs of hunger. This is called "breastfeeding on demand." Avoid introducing a pacifier to your baby while you are working to establish breastfeeding (the first 4-6 weeks after your baby is born). After this time you may choose to use a pacifier. Research has shown that pacifier use during the first year of a baby's life decreases the risk of sudden infant death syndrome (SIDS). °Allow your baby to feed on each breast as long as he or she wants. Breastfeed until your baby is finished feeding. When your baby unlatches or falls asleep while feeding from the first breast, offer the second breast. Because newborns are often sleepy in the first few weeks of life, you may need to awaken your baby to get him or her to feed. °Breastfeeding times will vary from baby to baby. However, the following rules can serve as a guide to help you ensure that your baby is properly fed: °· Newborns (babies 4 weeks of age or younger) may breastfeed every 1-3 hours. °· Newborns should not go longer than 3 hours during the day or 5 hours during the night without breastfeeding. °· You should breastfeed your baby a minimum of 8 times in a 24-hour period until you begin to introduce solid foods to your baby at around 6 months of age. °BREAST MILK PUMPING °Pumping and storing breast milk allows you to ensure that your baby is exclusively fed your breast milk, even at times when you are unable to breastfeed. This is especially important if you are going back to work while you are still breastfeeding or when you are not able to be present during  feedings. Your lactation consultant can give you guidelines on how long it is safe to store breast milk. °A breast pump is a machine that allows you to pump milk from your breast into a sterile bottle. The pumped breast milk can   then be stored in a refrigerator or freezer. Some breast pumps are operated by hand, while others use electricity. Ask your lactation consultant which type will work best for you. Breast pumps can be purchased, but some hospitals and breastfeeding support groups lease breast pumps on a monthly basis. A lactation consultant can teach you how to hand express breast milk, if you prefer not to use a pump. °CARING FOR YOUR BREASTS WHILE YOU BREASTFEED °Nipples can become dry, cracked, and sore while breastfeeding. The following recommendations can help keep your breasts moisturized and healthy: °· Avoid using soap on your nipples. °· Wear a supportive bra. Although not required, special nursing bras and tank tops are designed to allow access to your breasts for breastfeeding without taking off your entire bra or top. Avoid wearing underwire-style bras or extremely tight bras. °· Air dry your nipples for 3-4 minutes after each feeding. °· Use only cotton bra pads to absorb leaked breast milk. Leaking of breast milk between feedings is normal. °· Use lanolin on your nipples after breastfeeding. Lanolin helps to maintain your skin's normal moisture barrier. If you use pure lanolin, you do not need to wash it off before feeding your baby again. Pure lanolin is not toxic to your baby. You may also hand express a few drops of breast milk and gently massage that milk into your nipples and allow the milk to air dry. °In the first few weeks after giving birth, some women experience extremely full breasts (engorgement). Engorgement can make your breasts feel heavy, warm, and tender to the touch. Engorgement peaks within 3-5 days after you give birth. The following recommendations can help ease  engorgement: °· Completely empty your breasts while breastfeeding or pumping. You may want to start by applying warm, moist heat (in the shower or with warm water-soaked hand towels) just before feeding or pumping. This increases circulation and helps the milk flow. If your baby does not completely empty your breasts while breastfeeding, pump any extra milk after he or she is finished. °· Wear a snug bra (nursing or regular) or tank top for 1-2 days to signal your body to slightly decrease milk production. °· Apply ice packs to your breasts, unless this is too uncomfortable for you. °· Make sure that your baby is latched on and positioned properly while breastfeeding. °If engorgement persists after 48 hours of following these recommendations, contact your health care provider or a lactation consultant. °OVERALL HEALTH CARE RECOMMENDATIONS WHILE BREASTFEEDING °· Eat healthy foods. Alternate between meals and snacks, eating 3 of each per day. Because what you eat affects your breast milk, some of the foods may make your baby more irritable than usual. Avoid eating these foods if you are sure that they are negatively affecting your baby. °· Drink milk, fruit juice, and water to satisfy your thirst (about 10 glasses a day). °· Rest often, relax, and continue to take your prenatal vitamins to prevent fatigue, stress, and anemia. °· Continue breast self-awareness checks. °· Avoid chewing and smoking tobacco. Chemicals from cigarettes that pass into breast milk and exposure to secondhand smoke may harm your baby. °· Avoid alcohol and drug use, including marijuana. °Some medicines that may be harmful to your baby can pass through breast milk. It is important to ask your health care provider before taking any medicine, including all over-the-counter and prescription medicine as well as vitamin and herbal supplements. °It is possible to become pregnant while breastfeeding. If birth control is desired, ask your health care    provider about options that will be safe for your baby. °SEEK MEDICAL CARE IF: °· You feel like you want to stop breastfeeding or have become frustrated with breastfeeding. °· You have painful breasts or nipples. °· Your nipples are cracked or bleeding. °· Your breasts are red, tender, or warm. °· You have a swollen area on either breast. °· You have a fever or chills. °· You have nausea or vomiting. °· You have drainage other than breast milk from your nipples. °· Your breasts do not become full before feedings by the fifth day after you give birth. °· You feel sad and depressed. °· Your baby is too sleepy to eat well. °· Your baby is having trouble sleeping.   °· Your baby is wetting less than 3 diapers in a 24-hour period. °· Your baby has less than 3 stools in a 24-hour period. °· Your baby's skin or the white part of his or her eyes becomes yellow.   °· Your baby is not gaining weight by 5 days of age. °SEEK IMMEDIATE MEDICAL CARE IF: °· Your baby is overly tired (lethargic) and does not want to wake up and feed. °· Your baby develops an unexplained fever. °  °This information is not intended to replace advice given to you by your health care provider. Make sure you discuss any questions you have with your health care provider. °  °Document Released: 06/02/2005 Document Revised: 02/21/2015 Document Reviewed: 11/24/2012 °Elsevier Interactive Patient Education ©2016 [employer] ° °

## 2016-01-11 NOTE — Lactation Note (Signed)
This note was copied from a baby's chart. Lactation Consultation Note  Patient Name: Misty Harris AQTMA'U Date: 01/11/2016   Attempted to visit with mom at 10:30 AM, she was in the shower. GM reports that mom still plans to BF once her milk comes in .Went back to see mom and she has been d/c home.      Maternal Data    Feeding    LATCH Score/Interventions                      Lactation Tools Discussed/Used     Consult Status      Ed Blalock 01/11/2016, 12:05 PM

## 2016-01-11 NOTE — Lactation Note (Signed)
This note was copied from a baby's chart. Lactation Consultation Note Mom sleeping soundly. Patient Name: Misty Harris TYOMA'Y Date: 01/11/2016     Maternal Data    Feeding    LATCH Score/Interventions                      Lactation Tools Discussed/Used     Consult Status      Evonna Stoltz, Diamond Nickel 01/11/2016, 3:20 AM

## 2016-01-11 NOTE — Progress Notes (Signed)
Patient ID: Misty Harris, female   DOB: [birth year], 25 y.o.   MRN: [id number] Post Partum Day #2            Information for the patient's newborn:  Giliana, Norberg [[id number]]  female   / circumcision done Feeding: breast and bottle  Subjective: No HA, SOB, CP, F/C, breast symptoms. Pain minimally controlled with Tylenol and Motrin. Still having lots of sever abdominal cramping and lower back pain. Normal vaginal bleeding, no clots.      Objective:  Temp:  [97.8 F (36.6 C)-98.5 F (36.9 C)] 98.5 F (36.9 C) (07/28 0615) Pulse Rate:  [82-103] 82 (07/28 0615) Resp:  [18] 18 (07/28 0615) BP: (117-131)/(68-71) 117/71 (07/28 0615) SpO2:  [97 %] 97 % (07/27 1811)  No intake or output data in the 24 hours ending 01/11/16 0928     Recent Labs  01/10/16 0521  WBC 15.5*  HGB 11.4*  HCT 34.6*  PLT 189    Blood type: --/--/A POS, A POS (07/25 0100) Rubella: Immune (01/11 0000)    Physical Exam:  General: alert, cooperative and no distress Uterine Fundus: firm, midline, U-1 Lochia: appropriate Perineum: 1st degree repair healing, edema none DVT Evaluation: No evidence of DVT seen on physical exam. Negative Homan's sign. No cords or calf tenderness. No significant calf/ankle edema.    Assessment/Plan: PPD # 2 / 25 y.o., G1P1001 S/P: Induced Vaginal   Principal Problem:    Postpartum care following vaginal delivery with 1st degree perineal laceration (7/26)  Active Problems:    GDM, class A2    SVD (spontaneous vaginal delivery)   normal postpartum exam  Continue current postpartum care  D/C home  PP visit with Dr. Juliene Pina in 6 wks  Schedule 2 hr GTT for GDM A2/PCOS in 6 wks   LOS: 3 days   Kenard Gower, MSN, CNM 01/11/2016, 9:28 AM

## 2016-01-13 NOTE — Discharge Summary (Signed)
POSTPARTUM DISCHARGE SUMMARY:  Patient ID: Misty Harris MRN: [id number] DOB/AGE: Aug 18, 1990 24 y.o.  Admit date: 01/08/2016 Admission Diagnoses: IOL for GDMA2 / PCOS / (+) GBS  Discharge date: 01/11/2016  Discharge Diagnoses: S/P SVD with 1st degree laceration on 01/09/2016  Prenatal history: G1P1001   EDC : 01/15/2016, Ultrasound Prenatal care at [facility]  Primary provider : Dr. Juliene Harris Prenatal course complicated by Infertility / GDMA2 / maternal Obesity / (+) GBS  Prenatal Labs: ABO, Rh: --/--/A POS, A POS (07/25 0100)  Antibody: NEG (07/25 0100) Rubella: Immune (01/11 0000)  RPR: Non Reactive (07/25 0100)  HBsAg: Negative (01/11 0000)  HIV: Non-reactive (01/11 0000)  GTT : Abnormal 1 and 3 hr GTTs GBS: Positive (07/06 0000)   Medical / Surgical History :  Past medical history:  Past Medical History:  Diagnosis Date  . GDM, class A2 01/08/2016  . Medical history non-contributory     Past surgical history:  Past Surgical History:  Procedure Laterality Date  . NO PAST SURGERIES      Family History:  Family History  Problem Relation Age of Onset  . Diabetes Father   . Heart disease Father   . Hypertension Father     Social History:  reports that she has never smoked. She has never used smokeless tobacco. She reports that she does not drink alcohol or use drugs.  Allergies: Review of patient's allergies indicates no known allergies.   Current Medications at time of admission:  Prior to Admission medications   Medication Sig Start Date End Date Taking? Authorizing Provider  IRON PO Take 1 tablet by mouth daily.   Yes Historical Provider, MD  Prenatal Vit-Fe Fumarate-FA (PRENATAL MULTIVITAMIN) TABS tablet Take 1 tablet by mouth 2 (two) times daily.   Yes Historical Provider, MD  ibuprofen (ADVIL,MOTRIN) 600 MG tablet Take 1 tablet (600 mg total) by mouth every 6 (six) hours. 01/11/16   Raelyn Mora, CNM  oxyCODONE-acetaminophen (ROXICET) 5-325 MG  tablet Take 1-2 tablets by mouth every 4 (four) hours as needed for severe pain. 01/11/16   Raelyn Mora, CNM    Intrapartum Course:  Admitted for induction of labor with labor progression to complete dilation with normal labor curve / Pain management: epidural / Spontanous Vaginal delivery on 01/09/2016 with delivery of viable female newborn and repair of 1st degree perineal laceration by Dr Misty Harris   APGAR (1 MIN): 8   APGAR (5 MINS): 9    Postpartum course:  Uncomplicated with discharge on PPD 2 Complicated by: N/A  Discharge Instructions:  Discharged Condition: stable  Activity: pelvic rest    Diet: routine  Medications:    Medication List    STOP taking these medications   azithromycin 500 MG tablet Commonly known as:  ZITHROMAX     TAKE these medications   ibuprofen 600 MG tablet Commonly known as:  ADVIL,MOTRIN Take 1 tablet (600 mg total) by mouth every 6 (six) hours.   IRON PO Take 1 tablet by mouth daily.   oxyCODONE-acetaminophen 5-325 MG tablet Commonly known as:  ROXICET Take 1-2 tablets by mouth every 4 (four) hours as needed for severe pain.   prenatal multivitamin Tabs tablet Take 1 tablet by mouth 2 (two) times daily.       Postpartum Instructions: [facility] discharge booklet - instructions reviewed  Discharge to: Home  Follow up :  [facility] in 6 weeks for routine postpartum visit with Dr. Juliene Harris  Signed: Raelyn Mora, Judie Petit MSN, CNM 01/11/2016, 11:19 AM

## 2016-06-16 NOTE — L&D Delivery Note (Signed)
Delivery Note At 10:45 AM a viable female was delivered via  (Presentation: ;DOA  ).  APGAR: per nursing notes- good cry and tone, ; weight  pending.   Placenta status: intact, spontaneous , .  Cord:  3VC with the following complications: none .  Cord pH: n/a  Anesthesia:  epidural Episiotomy:  none Lacerations:  2nd Suture Repair: 3.0 vicryl rapide Est. Blood Loss (mL):  300  Mom to postpartum.  Baby to Couplet care / Skin to Skin.  Lendon ColonelKelly A Domique Clapper 06/14/2017, 10:59 AM

## 2016-11-03 DIAGNOSIS — Z8742 Personal history of other diseases of the female genital tract: Secondary | ICD-10-CM | POA: Diagnosis not present

## 2016-11-03 DIAGNOSIS — Z3201 Encounter for pregnancy test, result positive: Secondary | ICD-10-CM | POA: Diagnosis not present

## 2016-11-18 DIAGNOSIS — Z3201 Encounter for pregnancy test, result positive: Secondary | ICD-10-CM | POA: Diagnosis not present

## 2016-12-11 DIAGNOSIS — Z3481 Encounter for supervision of other normal pregnancy, first trimester: Secondary | ICD-10-CM | POA: Diagnosis not present

## 2016-12-11 DIAGNOSIS — Z36 Encounter for antenatal screening for chromosomal anomalies: Secondary | ICD-10-CM | POA: Diagnosis not present

## 2016-12-11 DIAGNOSIS — Z8632 Personal history of gestational diabetes: Secondary | ICD-10-CM | POA: Diagnosis not present

## 2016-12-11 DIAGNOSIS — Z3689 Encounter for other specified antenatal screening: Secondary | ICD-10-CM | POA: Diagnosis not present

## 2016-12-11 DIAGNOSIS — Z3682 Encounter for antenatal screening for nuchal translucency: Secondary | ICD-10-CM | POA: Diagnosis not present

## 2016-12-11 DIAGNOSIS — Z3491 Encounter for supervision of normal pregnancy, unspecified, first trimester: Secondary | ICD-10-CM | POA: Diagnosis not present

## 2016-12-11 DIAGNOSIS — Z113 Encounter for screening for infections with a predominantly sexual mode of transmission: Secondary | ICD-10-CM | POA: Diagnosis not present

## 2016-12-11 LAB — OB RESULTS CONSOLE HEPATITIS B SURFACE ANTIGEN: HEP B S AG: NEGATIVE

## 2016-12-11 LAB — OB RESULTS CONSOLE GC/CHLAMYDIA
CHLAMYDIA, DNA PROBE: NEGATIVE
Gonorrhea: NEGATIVE

## 2016-12-11 LAB — OB RESULTS CONSOLE RUBELLA ANTIBODY, IGM: RUBELLA: IMMUNE

## 2016-12-11 LAB — OB RESULTS CONSOLE HIV ANTIBODY (ROUTINE TESTING): HIV: NONREACTIVE

## 2016-12-11 LAB — OB RESULTS CONSOLE RPR: RPR: NONREACTIVE

## 2017-01-02 DIAGNOSIS — Z3689 Encounter for other specified antenatal screening: Secondary | ICD-10-CM | POA: Diagnosis not present

## 2017-01-02 DIAGNOSIS — Z361 Encounter for antenatal screening for raised alphafetoprotein level: Secondary | ICD-10-CM | POA: Diagnosis not present

## 2017-01-02 DIAGNOSIS — N390 Urinary tract infection, site not specified: Secondary | ICD-10-CM | POA: Diagnosis not present

## 2017-01-02 DIAGNOSIS — Z3482 Encounter for supervision of other normal pregnancy, second trimester: Secondary | ICD-10-CM | POA: Diagnosis not present

## 2017-01-27 DIAGNOSIS — O358XX0 Maternal care for other (suspected) fetal abnormality and damage, not applicable or unspecified: Secondary | ICD-10-CM | POA: Diagnosis not present

## 2017-01-27 DIAGNOSIS — Z3A20 20 weeks gestation of pregnancy: Secondary | ICD-10-CM | POA: Diagnosis not present

## 2017-01-27 DIAGNOSIS — R8271 Bacteriuria: Secondary | ICD-10-CM | POA: Diagnosis not present

## 2017-01-27 LAB — OB RESULTS CONSOLE GBS: GBS: POSITIVE

## 2017-02-09 DIAGNOSIS — O358XX0 Maternal care for other (suspected) fetal abnormality and damage, not applicable or unspecified: Secondary | ICD-10-CM | POA: Diagnosis not present

## 2017-02-09 DIAGNOSIS — Z3A21 21 weeks gestation of pregnancy: Secondary | ICD-10-CM | POA: Diagnosis not present

## 2017-02-09 DIAGNOSIS — Z362 Encounter for other antenatal screening follow-up: Secondary | ICD-10-CM | POA: Diagnosis not present

## 2017-02-19 DIAGNOSIS — Z3A29 29 weeks gestation of pregnancy: Secondary | ICD-10-CM | POA: Diagnosis not present

## 2017-02-19 DIAGNOSIS — O2340 Unspecified infection of urinary tract in pregnancy, unspecified trimester: Secondary | ICD-10-CM | POA: Diagnosis not present

## 2017-03-05 DIAGNOSIS — O2340 Unspecified infection of urinary tract in pregnancy, unspecified trimester: Secondary | ICD-10-CM | POA: Diagnosis not present

## 2017-03-05 DIAGNOSIS — Z3A25 25 weeks gestation of pregnancy: Secondary | ICD-10-CM | POA: Diagnosis not present

## 2017-03-17 DIAGNOSIS — Z23 Encounter for immunization: Secondary | ICD-10-CM | POA: Diagnosis not present

## 2017-03-17 DIAGNOSIS — O358XX0 Maternal care for other (suspected) fetal abnormality and damage, not applicable or unspecified: Secondary | ICD-10-CM | POA: Diagnosis not present

## 2017-03-17 DIAGNOSIS — Z3A27 27 weeks gestation of pregnancy: Secondary | ICD-10-CM | POA: Diagnosis not present

## 2017-03-17 DIAGNOSIS — Z3689 Encounter for other specified antenatal screening: Secondary | ICD-10-CM | POA: Diagnosis not present

## 2017-04-02 DIAGNOSIS — O358XX0 Maternal care for other (suspected) fetal abnormality and damage, not applicable or unspecified: Secondary | ICD-10-CM | POA: Diagnosis not present

## 2017-04-02 DIAGNOSIS — Z3689 Encounter for other specified antenatal screening: Secondary | ICD-10-CM | POA: Diagnosis not present

## 2017-04-02 DIAGNOSIS — Z3A29 29 weeks gestation of pregnancy: Secondary | ICD-10-CM | POA: Diagnosis not present

## 2017-04-14 DIAGNOSIS — Z3A31 31 weeks gestation of pregnancy: Secondary | ICD-10-CM | POA: Diagnosis not present

## 2017-04-14 DIAGNOSIS — O358XX0 Maternal care for other (suspected) fetal abnormality and damage, not applicable or unspecified: Secondary | ICD-10-CM | POA: Diagnosis not present

## 2017-05-01 DIAGNOSIS — Z3A32 32 weeks gestation of pregnancy: Secondary | ICD-10-CM | POA: Diagnosis not present

## 2017-05-01 DIAGNOSIS — O358XX0 Maternal care for other (suspected) fetal abnormality and damage, not applicable or unspecified: Secondary | ICD-10-CM | POA: Diagnosis not present

## 2017-05-14 DIAGNOSIS — Z3A34 34 weeks gestation of pregnancy: Secondary | ICD-10-CM | POA: Diagnosis not present

## 2017-05-14 DIAGNOSIS — O24410 Gestational diabetes mellitus in pregnancy, diet controlled: Secondary | ICD-10-CM | POA: Diagnosis not present

## 2017-05-27 DIAGNOSIS — O358XX0 Maternal care for other (suspected) fetal abnormality and damage, not applicable or unspecified: Secondary | ICD-10-CM | POA: Diagnosis not present

## 2017-05-27 DIAGNOSIS — O24410 Gestational diabetes mellitus in pregnancy, diet controlled: Secondary | ICD-10-CM | POA: Diagnosis not present

## 2017-05-27 DIAGNOSIS — Z3A36 36 weeks gestation of pregnancy: Secondary | ICD-10-CM | POA: Diagnosis not present

## 2017-06-03 DIAGNOSIS — Z3A37 37 weeks gestation of pregnancy: Secondary | ICD-10-CM | POA: Diagnosis not present

## 2017-06-03 DIAGNOSIS — O24410 Gestational diabetes mellitus in pregnancy, diet controlled: Secondary | ICD-10-CM | POA: Diagnosis not present

## 2017-06-10 ENCOUNTER — Telehealth (HOSPITAL_COMMUNITY): Payer: Self-pay | Admitting: *Deleted

## 2017-06-10 ENCOUNTER — Encounter (HOSPITAL_COMMUNITY): Payer: Self-pay | Admitting: *Deleted

## 2017-06-10 DIAGNOSIS — Z3A38 38 weeks gestation of pregnancy: Secondary | ICD-10-CM | POA: Diagnosis not present

## 2017-06-10 DIAGNOSIS — O24410 Gestational diabetes mellitus in pregnancy, diet controlled: Secondary | ICD-10-CM | POA: Diagnosis not present

## 2017-06-10 NOTE — Telephone Encounter (Signed)
Preadmission screen  

## 2017-06-11 ENCOUNTER — Encounter (HOSPITAL_COMMUNITY): Payer: Self-pay

## 2017-06-12 ENCOUNTER — Other Ambulatory Visit: Payer: Self-pay | Admitting: Obstetrics & Gynecology

## 2017-06-12 DIAGNOSIS — O24410 Gestational diabetes mellitus in pregnancy, diet controlled: Secondary | ICD-10-CM | POA: Diagnosis not present

## 2017-06-12 DIAGNOSIS — Z3A38 38 weeks gestation of pregnancy: Secondary | ICD-10-CM | POA: Diagnosis not present

## 2017-06-14 ENCOUNTER — Encounter (HOSPITAL_COMMUNITY): Payer: Self-pay

## 2017-06-14 ENCOUNTER — Inpatient Hospital Stay (HOSPITAL_COMMUNITY)
Admission: AD | Admit: 2017-06-14 | Discharge: 2017-06-15 | DRG: 807 | Disposition: A | Discharge status: Home / Self Care | Payer: BLUE CROSS/BLUE SHIELD | Source: Ambulatory Visit | Attending: Obstetrics & Gynecology | Admitting: Obstetrics & Gynecology

## 2017-06-14 ENCOUNTER — Other Ambulatory Visit: Payer: Self-pay

## 2017-06-14 ENCOUNTER — Inpatient Hospital Stay (HOSPITAL_COMMUNITY): Payer: BLUE CROSS/BLUE SHIELD | Admitting: Anesthesiology

## 2017-06-14 DIAGNOSIS — O99824 Streptococcus B carrier state complicating childbirth: Secondary | ICD-10-CM | POA: Diagnosis not present

## 2017-06-14 DIAGNOSIS — O24429 Gestational diabetes mellitus in childbirth, unspecified control: Secondary | ICD-10-CM | POA: Diagnosis not present

## 2017-06-14 DIAGNOSIS — E669 Obesity, unspecified: Secondary | ICD-10-CM | POA: Diagnosis present

## 2017-06-14 DIAGNOSIS — Z3A38 38 weeks gestation of pregnancy: Secondary | ICD-10-CM | POA: Diagnosis not present

## 2017-06-14 DIAGNOSIS — O99214 Obesity complicating childbirth: Secondary | ICD-10-CM | POA: Diagnosis present

## 2017-06-14 DIAGNOSIS — Z3483 Encounter for supervision of other normal pregnancy, third trimester: Secondary | ICD-10-CM | POA: Diagnosis not present

## 2017-06-14 DIAGNOSIS — Z3490 Encounter for supervision of normal pregnancy, unspecified, unspecified trimester: Secondary | ICD-10-CM | POA: Diagnosis present

## 2017-06-14 DIAGNOSIS — O24425 Gestational diabetes mellitus in childbirth, controlled by oral hypoglycemic drugs: Principal | ICD-10-CM | POA: Diagnosis present

## 2017-06-14 LAB — CBC
HEMATOCRIT: 37.8 % (ref 36.0–46.0)
HEMOGLOBIN: 12.5 g/dL (ref 12.0–15.0)
MCH: 27.1 pg (ref 26.0–34.0)
MCHC: 33.1 g/dL (ref 30.0–36.0)
MCV: 81.8 fL (ref 78.0–100.0)
Platelets: 208 10*3/uL (ref 150–400)
RBC: 4.62 MIL/uL (ref 3.87–5.11)
RDW: 14.6 % (ref 11.5–15.5)
WBC: 12.6 10*3/uL — ABNORMAL HIGH (ref 4.0–10.5)

## 2017-06-14 LAB — GLUCOSE, CAPILLARY
GLUCOSE-CAPILLARY: 81 mg/dL (ref 65–99)
Glucose-Capillary: 92 mg/dL (ref 65–99)

## 2017-06-14 LAB — TYPE AND SCREEN
ABO/RH(D): A POS
ANTIBODY SCREEN: NEGATIVE

## 2017-06-14 LAB — RPR: RPR: NONREACTIVE

## 2017-06-14 MED ORDER — EPHEDRINE 5 MG/ML INJ
10.0000 mg | INTRAVENOUS | Status: DC | PRN
  Filled 2017-06-14: qty 2

## 2017-06-14 MED ORDER — LACTATED RINGERS IV SOLN
500.0000 mL | INTRAVENOUS | Status: DC | PRN
  Administered 2017-06-14: 500 mL via INTRAVENOUS
  Administered 2017-06-14: 1000 mL via INTRAVENOUS

## 2017-06-14 MED ORDER — OXYTOCIN 40 UNITS IN LACTATED RINGERS INFUSION - SIMPLE MED
2.5000 [IU]/h | INTRAVENOUS | Status: DC
  Filled 2017-06-14: qty 1000

## 2017-06-14 MED ORDER — LACTATED RINGERS IV SOLN
500.0000 mL | INTRAVENOUS | Status: DC | PRN
Start: 2017-06-14 — End: 2017-06-14

## 2017-06-14 MED ORDER — OXYTOCIN BOLUS FROM INFUSION
500.0000 mL | Freq: Once | INTRAVENOUS | Status: AC
  Administered 2017-06-14: 500 mL via INTRAVENOUS

## 2017-06-14 MED ORDER — COCONUT OIL OIL: 1.0000 "application " | TOPICAL_OIL | Status: DC | PRN

## 2017-06-14 MED ORDER — PRENATAL MULTIVITAMIN CH
1.0000 | ORAL_TABLET | Freq: Every day | ORAL | Status: DC
  Administered 2017-06-15: 1 via ORAL
  Filled 2017-06-14: qty 1

## 2017-06-14 MED ORDER — OXYTOCIN 40 UNITS IN LACTATED RINGERS INFUSION - SIMPLE MED
1.0000 m[IU]/min | INTRAVENOUS | Status: DC
  Administered 2017-06-14: 2 m[IU]/min via INTRAVENOUS

## 2017-06-14 MED ORDER — ONDANSETRON HCL 4 MG/2ML IJ SOLN: 4.0000 mg | INTRAMUSCULAR | Status: DC | PRN

## 2017-06-14 MED ORDER — SIMETHICONE 80 MG PO CHEW: 80.0000 mg | CHEWABLE_TABLET | ORAL | Status: DC | PRN

## 2017-06-14 MED ORDER — PENICILLIN G POT IN DEXTROSE 60000 UNIT/ML IV SOLN
3.0000 10*6.[IU] | INTRAVENOUS | Status: DC
  Filled 2017-06-14 (×3): qty 50

## 2017-06-14 MED ORDER — SODIUM CHLORIDE 0.9 % IV SOLN
1.0000 g | INTRAVENOUS | Status: DC
  Administered 2017-06-14: 1 g via INTRAVENOUS
  Filled 2017-06-14 (×4): qty 1000

## 2017-06-14 MED ORDER — PHENYLEPHRINE 40 MCG/ML (10ML) SYRINGE FOR IV PUSH (FOR BLOOD PRESSURE SUPPORT)
80.0000 ug | PREFILLED_SYRINGE | INTRAVENOUS | Status: DC | PRN
  Filled 2017-06-14: qty 5

## 2017-06-14 MED ORDER — ACETAMINOPHEN 325 MG PO TABS
650.0000 mg | ORAL_TABLET | ORAL | Status: DC | PRN
  Administered 2017-06-15: 650 mg via ORAL
  Filled 2017-06-14: qty 2

## 2017-06-14 MED ORDER — TETANUS-DIPHTH-ACELL PERTUSSIS 5-2.5-18.5 LF-MCG/0.5 IM SUSP: 0.5000 mL | Freq: Once | INTRAMUSCULAR | Status: DC

## 2017-06-14 MED ORDER — IBUPROFEN 600 MG PO TABS
600.0000 mg | ORAL_TABLET | Freq: Four times a day (QID) | ORAL | Status: DC
  Administered 2017-06-14 – 2017-06-15 (×6): 600 mg via ORAL
  Filled 2017-06-14 (×6): qty 1

## 2017-06-14 MED ORDER — ACETAMINOPHEN 325 MG PO TABS: 650.0000 mg | ORAL_TABLET | ORAL | Status: DC | PRN

## 2017-06-14 MED ORDER — OXYTOCIN BOLUS FROM INFUSION: 500.0000 mL | Freq: Once | INTRAVENOUS | Status: DC

## 2017-06-14 MED ORDER — OXYCODONE-ACETAMINOPHEN 5-325 MG PO TABS: 2.0000 | ORAL_TABLET | ORAL | Status: DC | PRN

## 2017-06-14 MED ORDER — DIPHENHYDRAMINE HCL 25 MG PO CAPS: 25.0000 mg | ORAL_CAPSULE | Freq: Four times a day (QID) | ORAL | Status: DC | PRN

## 2017-06-14 MED ORDER — OXYTOCIN 40 UNITS IN LACTATED RINGERS INFUSION - SIMPLE MED: 1.0000 m[IU]/min | INTRAVENOUS | Status: DC

## 2017-06-14 MED ORDER — ONDANSETRON HCL 4 MG/2ML IJ SOLN: 4.0000 mg | Freq: Four times a day (QID) | INTRAMUSCULAR | Status: DC | PRN

## 2017-06-14 MED ORDER — PENICILLIN G POTASSIUM 5000000 UNITS IJ SOLR
5.0000 10*6.[IU] | Freq: Once | INTRAVENOUS | Status: DC
  Filled 2017-06-14: qty 5

## 2017-06-14 MED ORDER — LIDOCAINE HCL (PF) 1 % IJ SOLN
30.0000 mL | INTRAMUSCULAR | Status: DC | PRN
  Filled 2017-06-14: qty 30

## 2017-06-14 MED ORDER — DIBUCAINE 1 % RE OINT: 1.0000 "application " | TOPICAL_OINTMENT | RECTAL | Status: DC | PRN

## 2017-06-14 MED ORDER — LIDOCAINE HCL (PF) 1 % IJ SOLN
INTRAMUSCULAR | Status: DC | PRN
  Administered 2017-06-14: 3 mL via EPIDURAL
  Administered 2017-06-14: 5 mL via EPIDURAL
  Administered 2017-06-14: 2 mL via EPIDURAL

## 2017-06-14 MED ORDER — LACTATED RINGERS IV SOLN: INTRAVENOUS | Status: DC

## 2017-06-14 MED ORDER — OXYCODONE-ACETAMINOPHEN 5-325 MG PO TABS: 1.0000 | ORAL_TABLET | ORAL | Status: DC | PRN

## 2017-06-14 MED ORDER — WITCH HAZEL-GLYCERIN EX PADS: 1.0000 "application " | MEDICATED_PAD | CUTANEOUS | Status: DC | PRN

## 2017-06-14 MED ORDER — LACTATED RINGERS IV SOLN
INTRAVENOUS | Status: DC
  Administered 2017-06-14: 07:00:00 via INTRAVENOUS

## 2017-06-14 MED ORDER — OXYCODONE HCL 5 MG PO TABS
10.0000 mg | ORAL_TABLET | ORAL | Status: DC | PRN
  Administered 2017-06-15 (×2): 10 mg via ORAL
  Filled 2017-06-14 (×2): qty 2

## 2017-06-14 MED ORDER — TERBUTALINE SULFATE 1 MG/ML IJ SOLN
0.2500 mg | Freq: Once | INTRAMUSCULAR | Status: DC | PRN
  Filled 2017-06-14: qty 1

## 2017-06-14 MED ORDER — SOD CITRATE-CITRIC ACID 500-334 MG/5ML PO SOLN: 30.0000 mL | ORAL | Status: DC | PRN

## 2017-06-14 MED ORDER — BENZOCAINE-MENTHOL 20-0.5 % EX AERO
1.0000 "application " | INHALATION_SPRAY | CUTANEOUS | Status: DC | PRN
  Administered 2017-06-15: 1 via TOPICAL
  Filled 2017-06-14 (×2): qty 56

## 2017-06-14 MED ORDER — SENNOSIDES-DOCUSATE SODIUM 8.6-50 MG PO TABS
2.0000 | ORAL_TABLET | ORAL | Status: DC
  Administered 2017-06-15: 2 via ORAL
  Filled 2017-06-14: qty 2

## 2017-06-14 MED ORDER — FLEET ENEMA 7-19 GM/118ML RE ENEM: 1.0000 | ENEMA | RECTAL | Status: DC | PRN

## 2017-06-14 MED ORDER — DIPHENHYDRAMINE HCL 50 MG/ML IJ SOLN: 12.5000 mg | INTRAMUSCULAR | Status: DC | PRN

## 2017-06-14 MED ORDER — FENTANYL 2.5 MCG/ML BUPIVACAINE 1/10 % EPIDURAL INFUSION (WH - ANES)
INTRAMUSCULAR | Status: AC
  Filled 2017-06-14: qty 100

## 2017-06-14 MED ORDER — OXYCODONE HCL 5 MG PO TABS
5.0000 mg | ORAL_TABLET | ORAL | Status: DC | PRN
  Administered 2017-06-15: 5 mg via ORAL
  Filled 2017-06-14: qty 1

## 2017-06-14 MED ORDER — OXYTOCIN 40 UNITS IN LACTATED RINGERS INFUSION - SIMPLE MED: 2.5000 [IU]/h | INTRAVENOUS | Status: DC

## 2017-06-14 MED ORDER — PHENYLEPHRINE 40 MCG/ML (10ML) SYRINGE FOR IV PUSH (FOR BLOOD PRESSURE SUPPORT)
PREFILLED_SYRINGE | INTRAVENOUS | Status: AC
  Filled 2017-06-14: qty 20

## 2017-06-14 MED ORDER — ZOLPIDEM TARTRATE 5 MG PO TABS: 5.0000 mg | ORAL_TABLET | Freq: Every evening | ORAL | Status: DC | PRN

## 2017-06-14 MED ORDER — ONDANSETRON HCL 4 MG PO TABS: 4.0000 mg | ORAL_TABLET | ORAL | Status: DC | PRN

## 2017-06-14 MED ORDER — LACTATED RINGERS IV SOLN: 500.0000 mL | Freq: Once | INTRAVENOUS | Status: DC

## 2017-06-14 MED ORDER — SODIUM CHLORIDE 0.9 % IV SOLN
2.0000 g | Freq: Once | INTRAVENOUS | Status: AC
  Administered 2017-06-14: 2 g via INTRAVENOUS
  Filled 2017-06-14: qty 2000

## 2017-06-14 MED ORDER — FENTANYL 2.5 MCG/ML BUPIVACAINE 1/10 % EPIDURAL INFUSION (WH - ANES)
14.0000 mL/h | INTRAMUSCULAR | Status: DC | PRN
  Administered 2017-06-14: 14 mL/h via EPIDURAL

## 2017-06-14 NOTE — H&P (Signed)
Misty Harris is a 26 y.o. G3P1001 at 1773w5d presenting for active labor. Pt notes onset contractions 4am . Good fetal movement, No vaginal bleeding, not leaking fluid.  [facility] 10 wks - h/o PCOS, spont preg - GDM, startng A1C 5.1, remained on metformin through pregnancy, DS at 28 wks off meds 134 - EIF, declined genetic screening GBS po Fetal growth- 30 wks 87%, 37 wks 77%   Prenatal Transfer Tool  Maternal Diabetes: Yes:  Diabetes Type:  Insulin/Medication controlled Genetic Screening: Declined Maternal Ultrasounds/Referrals: Abnormal:  Findings:   Isolated EIF (echogenic intracardiac focus) Fetal Ultrasounds or other Referrals:  None Maternal Substance Abuse:  No Significant Maternal Medications:  None Significant Maternal Lab Results: None     OB History    Gravida Para Term Preterm AB Living   3 1 1     1    SAB TAB Ectopic Multiple Live Births         0 1     Past Medical History:  Diagnosis Date  . GDM, class A2 01/08/2016  . Medical history non-contributory   . PCOS (polycystic ovarian syndrome)    Past Surgical History:  Procedure Laterality Date  . NO PAST SURGERIES     Family History: family history includes Diabetes in her father; Heart disease in her father; Hypertension in her father. Social History:  reports that  has never smoked. she has never used smokeless tobacco. She reports that she does not drink alcohol or use drugs.  Review of Systems - Negative except contractions   Dilation: 7 Effacement (%): 100 Station: 0 Exam by:: Dr Ernestina PennaFogleman Blood pressure 106/66, pulse 68, temperature 98 F (36.7 C), temperature source Oral, resp. rate 18, height 5\' 4"  (1.626 m), weight 97.5 kg (215 lb), SpO2 100 %, unknown if currently breastfeeding.  Physical Exam:  Gen: well appearing, no distress- now comfortable with epidural Abd: gravid, NT, no RUQ pain LE: no edema, equal bilaterally, non-tender Toco: q5-7- pitocin starting now FH:  baseline 140s, accelerations present, no deceleratons, 10 beat variability  Prenatal labs: ABO, Rh: --/--/A POS (12/30 [id number]) Antibody: NEG (12/30 [id number]) Rubella: Immune (06/28 0000) RPR: Nonreactive (06/28 0000)  HBsAg: Negative (06/28 0000)  HIV: Non-reactive (06/28 0000)  GBS: Positive (08/14 0000)  1 hr Glucola 134  Genetic screening declined Anatomy US normal   Assessment/Plan: 26 y.o. G3P1001 at 4373w5d Active labor, slowed since placing epidural, will add pitocin for augmentation. AROM when closer to abx completion GBS pos. Arrived in active labor ampicillin started Reactive fetal testing   Lendon ColonelKelly A Minola Guin 06/14/2017, 8:58 AM

## 2017-06-14 NOTE — MAU Note (Signed)
Contractions strong every 2-5 min. No leaking. No bleeding. Mucus only. Baby moving well.

## 2017-06-14 NOTE — Anesthesia Preprocedure Evaluation (Signed)
Anesthesia Evaluation  Patient identified by MRN, date of birth, ID band Patient awake    Reviewed: Allergy & Precautions, NPO status , Patient's Chart, lab work & pertinent test results  Airway Mallampati: II  TM Distance: >3 FB Neck ROM: Full    Dental  (+) Teeth Intact, Dental Advisory Given   Pulmonary neg pulmonary ROS,    Pulmonary exam normal breath sounds clear to auscultation       Cardiovascular negative cardio ROS Normal cardiovascular exam Rhythm:Regular Rate:Normal     Neuro/Psych negative neurological ROS     GI/Hepatic negative GI ROS, Neg liver ROS,   Endo/Other  diabetes, Gestational, Oral Hypoglycemic AgentsObesity   Renal/GU negative Renal ROS     Musculoskeletal negative musculoskeletal ROS (+)   Abdominal   Peds  Hematology negative hematology ROS (+)   Anesthesia Other Findings Day of surgery medications reviewed with the patient.  Reproductive/Obstetrics                             Anesthesia Physical Anesthesia Plan  ASA: II  Anesthesia Plan: Epidural   Post-op Pain Management:    Induction:   PONV Risk Score and Plan: 2 and Treatment may vary due to age or medical condition  Airway Management Planned:   Additional Equipment:   Intra-op Plan:   Post-operative Plan:   Informed Consent: I have reviewed the patients History and Physical, chart, labs and discussed the procedure including the risks, benefits and alternatives for the proposed anesthesia with the patient or authorized representative who has indicated his/her understanding and acceptance.   Dental advisory given  Plan Discussed with:   Anesthesia Plan Comments: (Patient identified. Risks/Benefits/Options discussed with patient including but not limited to bleeding, infection, nerve damage, paralysis, failed block, incomplete pain control, headache, blood pressure changes, nausea, vomiting,  reactions to medication both or allergic, itching and postpartum back pain. Confirmed with bedside nurse the patient's most recent platelet count. Confirmed with patient that they are not currently taking any anticoagulation, have any bleeding history or any family history of bleeding disorders. Patient expressed understanding and wished to proceed. All questions were answered. )        Anesthesia Quick Evaluation

## 2017-06-14 NOTE — Progress Notes (Signed)
S: Doing well, no complaints, pain well controlled with epidural  O: BP (!) 98/58   Pulse 63   Temp 98 F (36.7 C) (Oral)   Resp 16   Ht 5\' 4"  (1.626 m)   Wt 97.5 kg (215 lb)   SpO2 100%   BMI 36.90 kg/m    FHT:  FHR: 140s bpm, variability: moderate,  accelerations:  Present,  decelerations:  Absent UC:   irregular, every 2-5 minutes SVE:   Dilation: 8 Effacement (%): 90 Station: 0 Exam by:: Dr Ernestina PennaFogleman  AROM, clear  A / P:  26 y.o.  Obstetric History   G3   P1   T1   P0   A0   L1    SAB0   TAB0   Ectopic0   Multiple0   Live Births1    at 7252w5d Spontaneous labor, progressing normally  Fetal Wellbeing:  Category I Pain Control:  Epidural  Anticipated MOD:  NSVD   GBS pos, on Amp GDM, check BS q2 hrs.   Lendon ColonelKelly A Declan Adamson 06/14/2017, 9:52 AM

## 2017-06-14 NOTE — Anesthesia Procedure Notes (Signed)
Epidural Patient location during procedure: OB Start time: 06/14/2017 6:53 AM End time: 06/14/2017 6:58 AM  Staffing Anesthesiologist: Cecile Hearingurk, Stephen Edward, MD Performed: anesthesiologist   Preanesthetic Checklist Completed: patient identified, pre-op evaluation, timeout performed, IV checked, risks and benefits discussed and monitors and equipment checked  Epidural Patient position: sitting Prep: DuraPrep Patient monitoring: blood pressure and continuous pulse ox Approach: midline Location: L3-L4 Injection technique: LOR air  Needle:  Needle type: Tuohy  Needle gauge: 17 G Needle length: 9 cm Needle insertion depth: 5 cm Catheter size: 19 Gauge Catheter at skin depth: 10 cm Test dose: negative and Other (1% Lidocaine)  Additional Notes Patient identified.  Risk benefits discussed including failed block, incomplete pain control, headache, nerve damage, paralysis, blood pressure changes, nausea, vomiting, reactions to medication both toxic or allergic, and postpartum back pain.  Patient expressed understanding and wished to proceed.  All questions were answered.  Sterile technique used throughout procedure and epidural site dressed with sterile barrier dressing. No paresthesia or other complications noted. The patient did not experience any signs of intravascular injection such as tinnitus or metallic taste in mouth nor signs of intrathecal spread such as rapid motor block. Please see nursing notes for vital signs. Reason for block:procedure for pain

## 2017-06-14 NOTE — Anesthesia Pain Management Evaluation Note (Addendum)
  CRNA Pain Management Visit Note  Patient: Misty Harris, 26 y.o., female  "Hello I am a member of the anesthesia team at [facility]. We have an anesthesia team available at all times to provide care throughout the hospital, including epidural management and anesthesia for C-section. I don't know your plan for the delivery whether it a natural birth, water birth, IV sedation, nitrous supplementation, doula or epidural, but we want to meet your pain goals."   1.Was your pain managed to your expectations on prior hospitalizations?   Yes   2.What is your expectation for pain management during this hospitalization?       3.How can we help you reach that goal? epidural  Record the patient's initial score and the patient's pain goal.   Pain: 0  Pain Goal: 5 [facility] wants you to be able to say your pain was always managed very well.  Misty Harris,Misty Harris 06/14/2017

## 2017-06-14 NOTE — Progress Notes (Signed)
Only triage portion of pt hx questions collected at this time due to pt very uncomfortable, wants epidural. IV started and Report called to Katie RN L&D and pt can go to 166.

## 2017-06-14 NOTE — Anesthesia Postprocedure Evaluation (Signed)
Anesthesia Post Note  Patient: Misty Harris  Procedure(s) Performed: AN AD HOC LABOR EPIDURAL     Patient location during evaluation: Mother Baby Anesthesia Type: Epidural Level of consciousness: awake Pain management: pain level controlled Vital Signs Assessment: post-procedure vital signs reviewed and stable Respiratory status: spontaneous breathing Cardiovascular status: stable Postop Assessment: no backache, no headache, epidural receding, patient able to bend at knees, no apparent nausea or vomiting and adequate PO intake Anesthetic complications: no    Last Vitals:  Vitals:   06/14/17 1336 06/14/17 1430  BP: 111/68 114/64  Pulse: 78 60  Resp: 18 16  Temp: 36.8 C 36.5 C  SpO2:      Last Pain:  Vitals:   06/14/17 1430  TempSrc: Oral  PainSc:    Pain Goal: Patients Stated Pain Goal: 0 (06/14/17 0549)               Fanny DanceMULLINS,Issac Moure

## 2017-06-14 NOTE — Lactation Note (Addendum)
This note was copied from a baby's chart. Lactation Consultation Note  Patient Name: Misty Harris Date: 06/14/2017 Reason for consult: Initial assessment   P2, Baby 5 hours old. Hx PCOS. Mother bf first child for approx 5 weeks.  She states she had difficulty sustaining latch.  She states this baby is latching much better. Baby latched in cradle hold upon entering. Mother hand expressed drops when baby came off.  Nipples evert and compressible. Repositioned baby to football hold. Intermittent sucks and swallows observed.  Reviewed breast compression. Discussed basics. Mom encouraged to feed baby 8-12 times/24 hours and with feeding cues.  Mom made aware of O/P services, breastfeeding support groups, community resources, and our phone # for post-discharge questions.     Maternal Data Has patient been taught Hand Expression?: Yes Does the patient have breastfeeding experience prior to this delivery?: Yes  Feeding Feeding Type: Breast Fed Length of feed: 10 min  LATCH Score Latch: Grasps breast easily, tongue down, lips flanged, rhythmical sucking.  Audible Swallowing: A few with stimulation  Type of Nipple: Everted at rest and after stimulation  Comfort (Breast/Nipple): Soft / non-tender  Hold (Positioning): Assistance needed to correctly position infant at breast and maintain latch.  LATCH Score: 8  Interventions Interventions: Breast feeding basics reviewed;Assisted with latch;Skin to skin;Hand express;Breast compression;Support pillows;Position options  Lactation Tools Discussed/Used     Consult Status Consult Status: Follow-up Date: 06/15/17 Follow-up type: In-patient    Dahlia ByesBerkelhammer, Ruth [facility] 06/14/2017, 4:22 PM

## 2017-06-15 LAB — CBC
HCT: 35.9 % — ABNORMAL LOW (ref 36.0–46.0)
Hemoglobin: 11.8 g/dL — ABNORMAL LOW (ref 12.0–15.0)
MCH: 26.9 pg (ref 26.0–34.0)
MCHC: 32.9 g/dL (ref 30.0–36.0)
MCV: 82 fL (ref 78.0–100.0)
PLATELETS: 208 10*3/uL (ref 150–400)
RBC: 4.38 MIL/uL (ref 3.87–5.11)
RDW: 14.7 % (ref 11.5–15.5)
WBC: 13.1 10*3/uL — AB (ref 4.0–10.5)

## 2017-06-15 LAB — GLUCOSE, CAPILLARY: Glucose-Capillary: 87 mg/dL (ref 65–99)

## 2017-06-15 MED ORDER — ACETAMINOPHEN 325 MG PO TABS: 650.0000 mg | ORAL_TABLET | ORAL | Status: AC | PRN

## 2017-06-15 MED ORDER — BENZOCAINE-MENTHOL 20-0.5 % EX AERO: 1.0000 "application " | INHALATION_SPRAY | CUTANEOUS | Status: AC | PRN

## 2017-06-15 MED ORDER — IBUPROFEN 600 MG PO TABS: 600.0000 mg | ORAL_TABLET | Freq: Four times a day (QID) | ORAL | 0 refills | Status: AC

## 2017-06-15 NOTE — Discharge Summary (Signed)
Obstetric Discharge Summary Reason for Admission: onset of labor, GDM A2 Prenatal Procedures: ultrasound Intrapartum Procedures: spontaneous vaginal delivery, GBS prophylaxis and epidural Postpartum Procedures: none Complications-Operative and Postpartum: 2nd degree perineal laceration Hemoglobin  Date Value Ref Range Status  06/15/2017 11.8 (L) 12.0 - 15.0 g/dL Final   HCT  Date Value Ref Range Status  06/15/2017 35.9 (L) 36.0 - 46.0 % Final    Physical Exam:  General: alert, cooperative and no distress Lochia: appropriate Uterine Fundus: firm Incision: healing well DVT Evaluation: No cords or calf tenderness. No significant calf/ankle edema.  Discharge Diagnoses: Term Pregnancy-delivered  Discharge Information: Date: 06/15/2017 Activity: pelvic rest Diet: routine Medications:  Allergies as of 06/15/2017   No Known Allergies     Medication List    STOP taking these medications   metFORMIN 500 MG tablet Commonly known as:  GLUCOPHAGE     TAKE these medications   acetaminophen 325 MG tablet Commonly known as:  TYLENOL Take 2 tablets (650 mg total) by mouth every 4 (four) hours as needed (for pain scale < 4).   benzocaine-Menthol 20-0.5 % Aero Commonly known as:  DERMOPLAST Apply 1 application topically as needed for irritation (perineal discomfort).   ibuprofen 600 MG tablet Commonly known as:  ADVIL,MOTRIN Take 1 tablet (600 mg total) by mouth every 6 (six) hours.   prenatal multivitamin Tabs tablet Take 1 tablet by mouth 2 (two) times daily.            Discharge Care Instructions  (From admission, onward)        Start     Ordered   06/15/17 0000  Discharge wound care:    Comments:  Sitz baths 2 times /day with warm water x 1 week   06/15/17 0940     Condition: stable Instructions: refer to practice specific booklet Discharge to: home Follow-up Information    Shea EvansMody, Vaishali, MD. Schedule an appointment as soon as possible for a visit in 6  week(s).   Specialty:  Obstetrics and Gynecology Contact information: [place] [place] [address] [phone]           Newborn Data: Live born female [name] Birth Weight: 7 lb 9.3 oz (3440 g) APGAR: 9, 9  Newborn Delivery   Birth date/time:  06/14/2017 10:45:00 Delivery type:  Vaginal, Spontaneous     Home with mother.  Neta MendsDaniela C Tonilynn Bieker, CNM 06/15/2017, 9:41 AM

## 2017-06-15 NOTE — Progress Notes (Signed)
PPD # 1 SVD Information for the patient's newborn:  Misty Harris, Boy [name] [[id number]][[id number]]  female      breast feeding  / Circumcision planned Baby name: [name]  S:  Reports feeling well, crampy, desires DC home today             Tolerating po/ No nausea or vomiting             Bleeding is moderate             Pain controlled with ibuprofen (OTC)             Up ad lib / ambulatory / voiding without difficulties        O:  A & O x 3, in no apparent distress              VS:  Vitals:   06/14/17 1430 06/14/17 1800 06/14/17 2310 06/15/17 0553  BP: 114/64 (!) 99/53 110/68 108/68  Pulse: 60 90 78 (!) 53  Resp: 16 20 14 16   Temp: 97.7 F (36.5 C) 98.6 F (37 C) 98.5 F (36.9 C) 97.6 F (36.4 C)  TempSrc: Oral Oral Oral Oral  SpO2:   99% 98%  Weight:      Height:        LABS:  Recent Labs    06/14/17 0609 06/15/17 0537  WBC 12.6* 13.1*  HGB 12.5 11.8*  HCT 37.8 35.9*  PLT 208 208    Blood type: --/--/A POS (12/30 [id number])  Rubella: Immune (06/28 0000)   I&O: I/O last 3 completed shifts: In: -  Out: 600 [Urine:300; Blood:300]          No intake/output data recorded.  Lungs: Clear and unlabored  Heart: regular rate and rhythm / no murmurs  Abdomen: soft, non-tender, non-distended             Fundus: firm, non-tender, U-1  Perineum: deferred, baby at breast  Lochia: small  Extremities: no edema, no calf pain or tenderness    A/P: PPD # 1 26 y.o., [id number]   Principal Problem:   Postpartum care following vaginal delivery (12/30) Active Problems:   Second-degree perineal laceration, with delivery   Doing well - stable status  Routine post partum orders             DC home today w/ instructions  [facility] and [facility]N     Neta Mendsaniela C Aaleyah Witherow, MSN, CNM 06/15/2017, 9:31 AM

## 2017-06-15 NOTE — Lactation Note (Signed)
This note was copied from a baby's chart. Lactation Consultation Note  Patient Name: Misty Harris NGEXB'MToday's Date: 06/15/2017  Pecola LeisureBaby is in nursery for circumcision.  Mom c/o uterine cramping with breastfeeding.  Discussed oxytocin causing milk letdown and uterine contractions.  Reassured this should improve after the first few days.  Mom states she sometimes feels initial latch on pain which improves during feeding.  Stressed importance of obtaining a deep latch.  Offered assist but mom states she will call if assist desired.   Maternal Data    Feeding Feeding Type: Breast Milk Length of feed: 8 min  LATCH Score                   Interventions    Lactation Tools Discussed/Used     Consult Status      Huston FoleyMOULDEN, Rachel Samples S 06/15/2017, 1:41 PM

## 2017-06-19 ENCOUNTER — Inpatient Hospital Stay (HOSPITAL_COMMUNITY): Payer: BLUE CROSS/BLUE SHIELD

## 2017-08-19 DIAGNOSIS — Z30430 Encounter for insertion of intrauterine contraceptive device: Secondary | ICD-10-CM | POA: Diagnosis not present

## 2017-08-19 DIAGNOSIS — O923 Agalactia: Secondary | ICD-10-CM | POA: Diagnosis not present

## 2017-09-21 DIAGNOSIS — Z30431 Encounter for routine checking of intrauterine contraceptive device: Secondary | ICD-10-CM | POA: Diagnosis not present

## 2017-09-21 DIAGNOSIS — Z8632 Personal history of gestational diabetes: Secondary | ICD-10-CM | POA: Diagnosis not present

## 2017-09-21 DIAGNOSIS — Z3689 Encounter for other specified antenatal screening: Secondary | ICD-10-CM | POA: Diagnosis not present

## 2017-09-26 IMAGING — US US MFM OB TRANSVAGINAL
1 series · 14 of 28 positions shown · non-contrast
Comparison: none

[Series 1: us mfm ob transvaginal · 50 acquisitions, 14 frames shown]
[im 2/50]
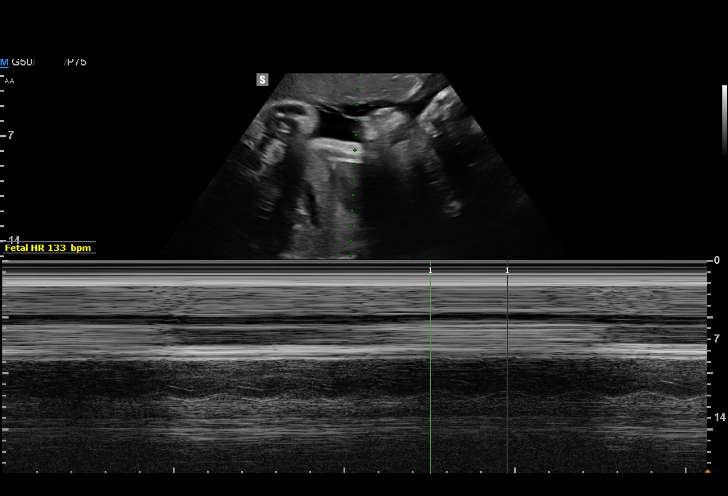
[im 6/50]
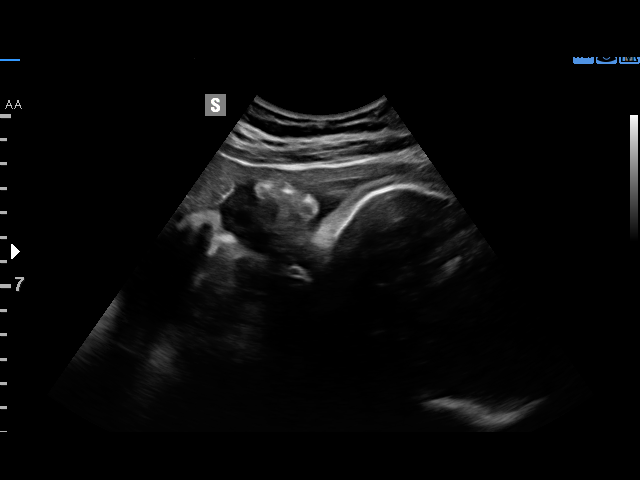
[im 10/50]
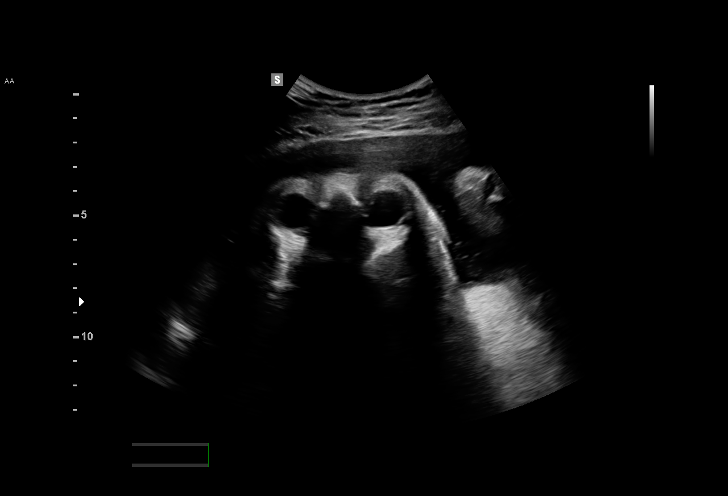
[im 13/50]
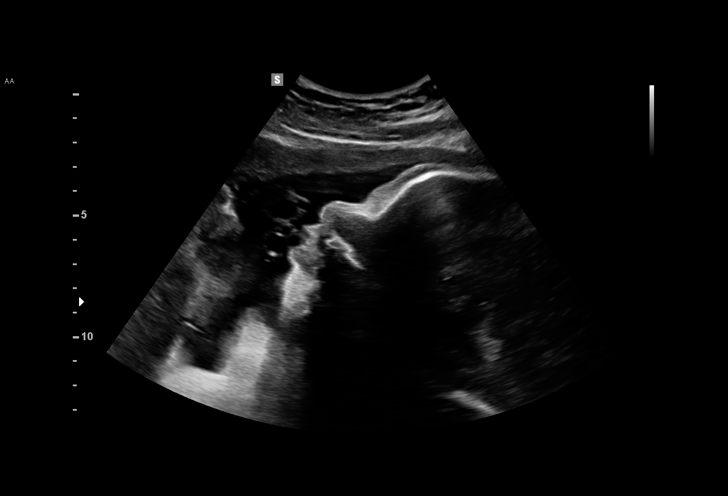
[im 17/50]
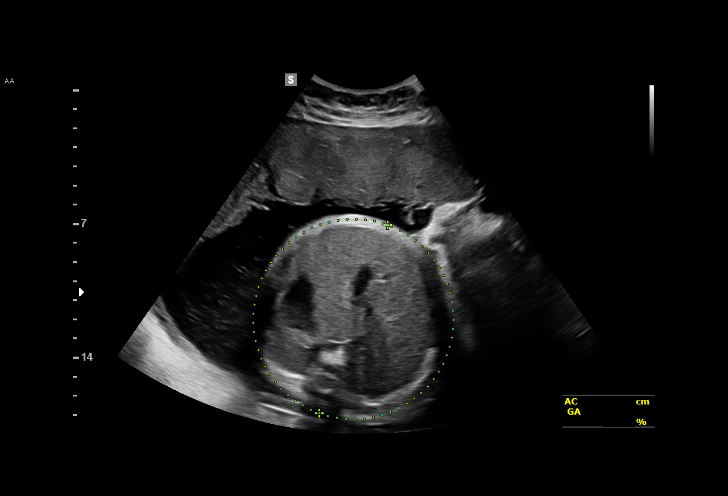
[im 20/50]
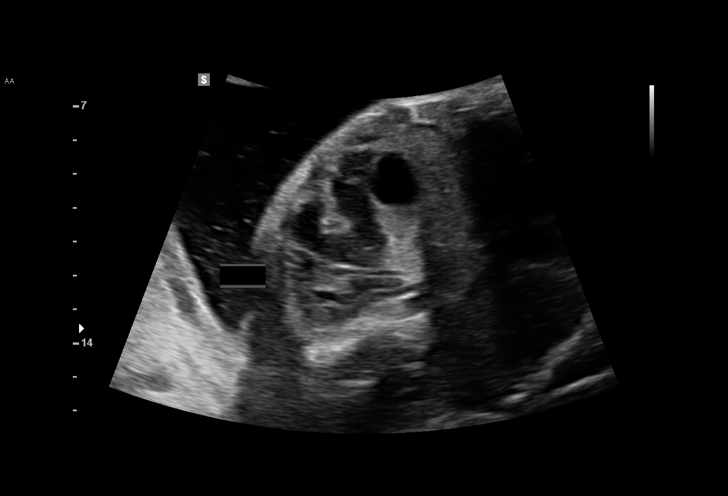
[im 24/50]
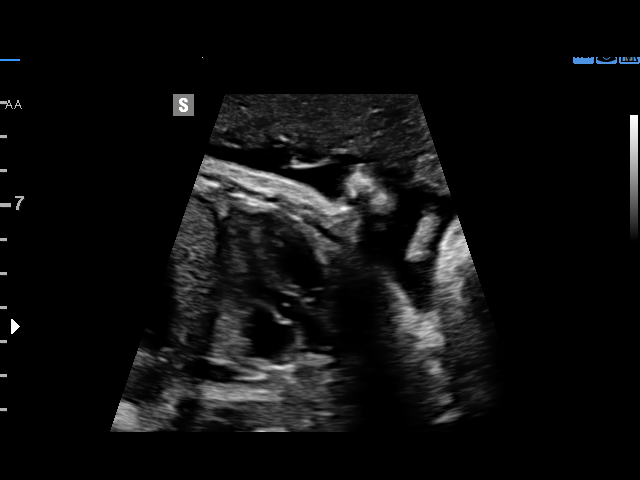
[im 28/50]
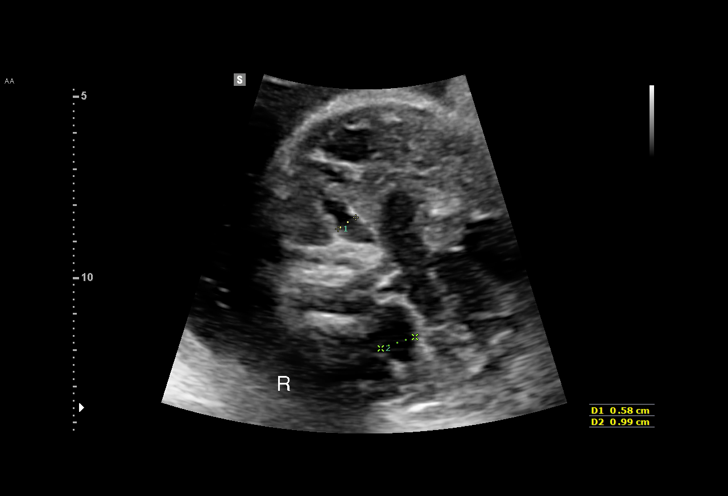
[im 31/50]
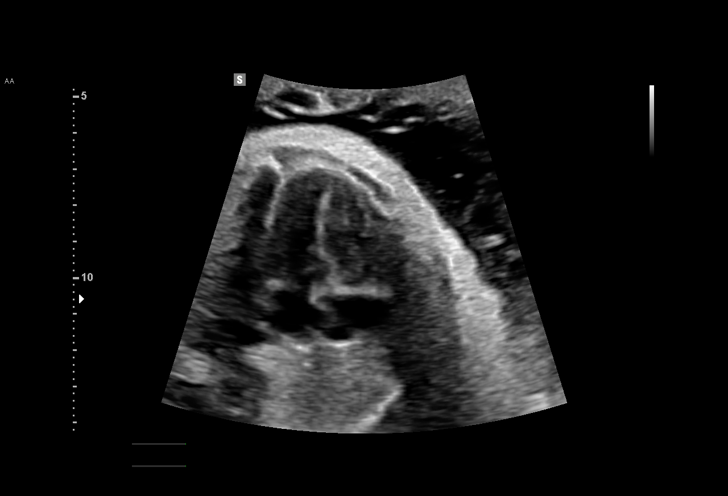
[im 35/50]
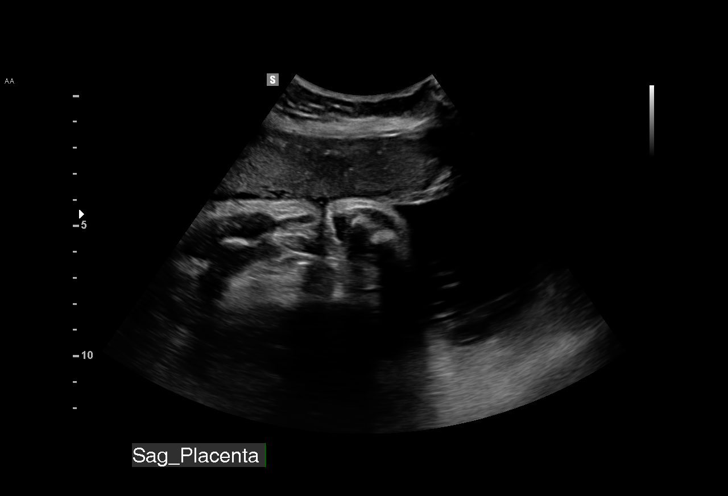
[im 39/50]
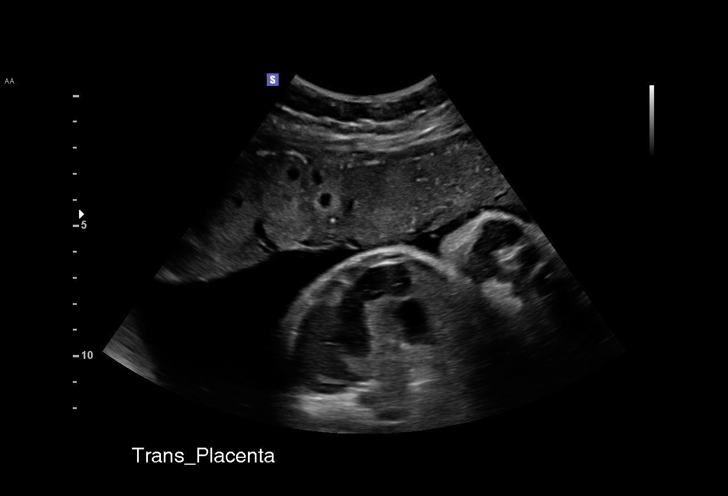
[im 42/50]
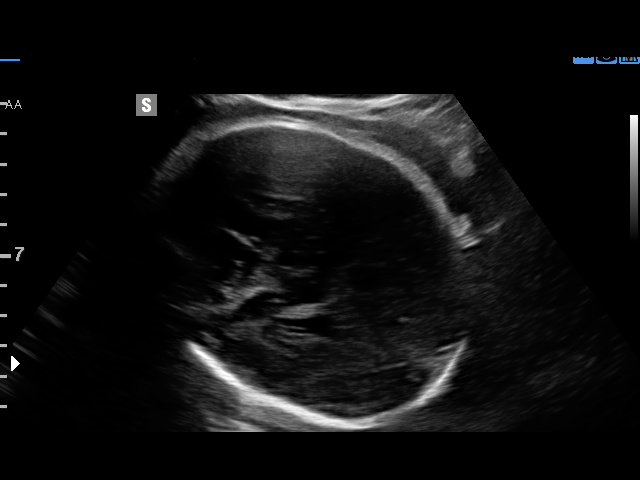
[im 46/50]
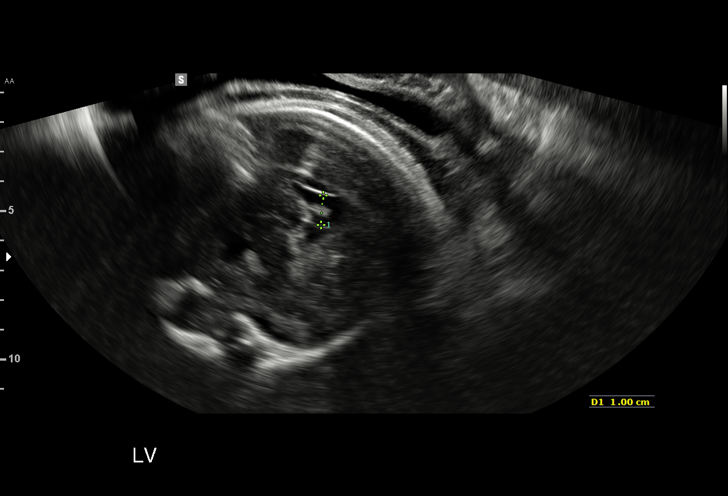
[im 50/50]
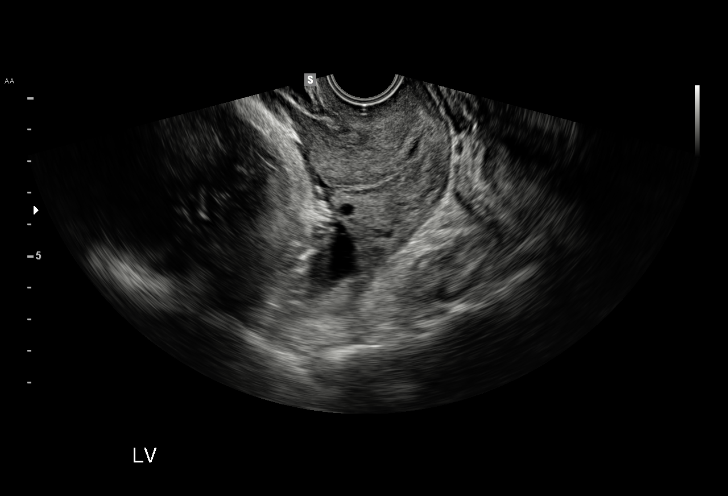

[14 of 28 positions shown; findings below may reference images not displayed]

1  AUNTYJATTY DELOWR            [id number]      [id number]     [id number]
2  AUNTYJATTY DELOWR            [id number]      [id number]     [id number]
Indications

36 weeks gestation of pregnancy
Detailed fetal anatomic survey                 Z36
Fetal abnormality - other known or
suspected ( renal pyelectasis and lat vent)
OB History

Gravidity:    1
Fetal Evaluation

Num Of Fetuses:     1
Fetal Heart         133
Rate(bpm):
Cardiac Activity:   Observed
Presentation:       Cephalic
Placenta:           Anterior, above cervical os
P. Cord Insertion:  Visualized

Amniotic Fluid
AFI FV:      Subjectively upper-normal

AFI Sum(cm)     %Tile       Largest Pocket(cm)
20.08           76

RUQ(cm)       RLQ(cm)       LUQ(cm)        LLQ(cm)
6.66
Biometry
BPD:      91.7  mm     G. Age:  37w 2d         85  %    CI:        74.35   %   70 - 86
FL/HC:      20.5   %   20.1 -
HC:      337.6  mm     G. Age:  38w 5d         80  %    HC/AC:      1.03       0.93 -
AC:      326.4  mm     G. Age:  36w 4d         72  %    FL/BPD:     75.6   %   71 - 87
FL:       69.3  mm     G. Age:  35w 4d         32  %    FL/AC:      21.2   %   20 - 24

Est. FW:    2621  gm      6 lb 9 oz     75  %
Gestational Age

LMP:           36w 1d       Date:   04/11/15                 EDD:   01/16/16
U/S Today:     37w 0d                                        EDD:   01/10/16
Best:          36w 1d    Det. By:   LMP  (04/11/15)          EDD:   01/16/16
Anatomy

Cranium:               Appears normal         Aortic Arch:            Not well visualized
Cavum:                 Appears normal         Ductal Arch:            Not well visualized
Ventricles:            Ventriculomegaly,      Diaphragm:              Not well visualized
rt 10.4mm
Choroid Plexus:        Appears normal         Stomach:                Appears normal, left
sided
Cerebellum:            Not well visualized    Abdomen:                Appears normal
Posterior Fossa:       Not well visualized    Abdominal Wall:         Not well visualized
Nuchal Fold:           Not applicable (>20    Cord Vessels:           Not well visualized
wks GA)
Face:                  Appears normal         Kidneys:                Right sided
(orbits and profile)
pyelectasis,  10
mm
Lips:                  Appears normal         Bladder:                Appears normal
Heart:                 Appears normal         Spine:                  Not well visualized
(4CH, axis, and situs
RVOT:                  Appears normal         Upper Extremities:      Not well visualized
LVOT:                  Appears normal         Lower Extremities:      Not well visualized

Other:  Technically difficult due to advanced GA and fetal position.
Impression

Single IUP at 36w 1d
A2 GDM on Metformin and Glyburide
Patient was seen due to renal pylectasis and cerebral
ventriculomegaly
Right urinary tract dilation is noted.  The renal pelvis
measures 10 mm.  No calyceal dilation is noted (UTD A1)
The fetal head is deep in the pelvis - on some images, mild /
minimal cerebral ventriculomegaly was noted measuring 10
mm.  Unable to better visualized with transvaginal
ultrasonography.
The remainder of the fetal cerebral anatomy appears normal
The estimated fetal weight is at the 75th %tile
Anterior placenta without previa
Normal amniotic fluid volume

Ultrasound findings and limitations were discussed with the
patient.  We briefly reviewed potential causes of
ventriculomegaly including aneuploid (Trisomy 21) and fetal
viral infections (TORCH).  At this current gestational age, do
no feel a full work up is warranted - would offer imaging
studies after delivery and further evaluation at that time.
Recommendations

Continue antenatal testing as scheduled due to A2 GDM
Notify Pediatrics at the time of delivery - recommend futher
imaging studies of the kidneys and cranial anatomy in the
newborn

## 2018-02-26 DIAGNOSIS — E282 Polycystic ovarian syndrome: Secondary | ICD-10-CM | POA: Diagnosis not present

## 2018-02-26 DIAGNOSIS — Z8632 Personal history of gestational diabetes: Secondary | ICD-10-CM | POA: Diagnosis not present

## 2018-02-26 DIAGNOSIS — R5383 Other fatigue: Secondary | ICD-10-CM | POA: Diagnosis not present

## 2018-04-27 DIAGNOSIS — Z6829 Body mass index (BMI) 29.0-29.9, adult: Secondary | ICD-10-CM | POA: Diagnosis not present

## 2018-04-27 DIAGNOSIS — E282 Polycystic ovarian syndrome: Secondary | ICD-10-CM | POA: Diagnosis not present

## 2018-04-27 DIAGNOSIS — E663 Overweight: Secondary | ICD-10-CM | POA: Diagnosis not present

## 2019-03-04 ENCOUNTER — Other Ambulatory Visit: Payer: Self-pay

## 2019-03-04 DIAGNOSIS — R6889 Other general symptoms and signs: Secondary | ICD-10-CM | POA: Diagnosis not present

## 2019-03-04 DIAGNOSIS — Z20822 Contact with and (suspected) exposure to covid-19: Secondary | ICD-10-CM

## 2019-03-06 LAB — NOVEL CORONAVIRUS, NAA: SARS-CoV-2, NAA: NOT DETECTED

## 2019-04-13 DIAGNOSIS — Z23 Encounter for immunization: Secondary | ICD-10-CM | POA: Diagnosis not present

## 2019-04-13 DIAGNOSIS — Z8632 Personal history of gestational diabetes: Secondary | ICD-10-CM | POA: Diagnosis not present

## 2019-04-13 DIAGNOSIS — Z6831 Body mass index (BMI) 31.0-31.9, adult: Secondary | ICD-10-CM | POA: Diagnosis not present

## 2019-04-13 DIAGNOSIS — E669 Obesity, unspecified: Secondary | ICD-10-CM | POA: Diagnosis not present

## 2019-04-13 DIAGNOSIS — Z8639 Personal history of other endocrine, nutritional and metabolic disease: Secondary | ICD-10-CM | POA: Diagnosis not present

## 2019-04-26 DIAGNOSIS — E669 Obesity, unspecified: Secondary | ICD-10-CM | POA: Diagnosis not present

## 2019-04-26 DIAGNOSIS — Z8632 Personal history of gestational diabetes: Secondary | ICD-10-CM | POA: Diagnosis not present

## 2019-04-26 DIAGNOSIS — Z8639 Personal history of other endocrine, nutritional and metabolic disease: Secondary | ICD-10-CM | POA: Diagnosis not present

## 2019-06-07 DIAGNOSIS — Z130 Encounter for screening for diseases of the blood and blood-forming organs and certain disorders involving the immune mechanism: Secondary | ICD-10-CM | POA: Diagnosis not present

## 2019-06-07 DIAGNOSIS — Z13220 Encounter for screening for lipoid disorders: Secondary | ICD-10-CM | POA: Diagnosis not present

## 2019-06-07 DIAGNOSIS — Z0000 Encounter for general adult medical examination without abnormal findings: Secondary | ICD-10-CM | POA: Diagnosis not present

## 2019-06-07 DIAGNOSIS — Z131 Encounter for screening for diabetes mellitus: Secondary | ICD-10-CM | POA: Diagnosis not present

## 2019-06-07 DIAGNOSIS — Z1329 Encounter for screening for other suspected endocrine disorder: Secondary | ICD-10-CM | POA: Diagnosis not present
